# Patient Record
Sex: Female | Born: 1990
Health system: Southern US, Community
[De-identification: ages and names within clinical notes are randomized; demographics above are authoritative.]

## PROBLEM LIST (undated history)

## (undated) ENCOUNTER — Inpatient Hospital Stay (HOSPITAL_COMMUNITY): Payer: Self-pay

## (undated) DIAGNOSIS — G43909 Migraine, unspecified, not intractable, without status migrainosus: Secondary | ICD-10-CM

## (undated) DIAGNOSIS — R519 Headache, unspecified: Secondary | ICD-10-CM

## (undated) DIAGNOSIS — B019 Varicella without complication: Secondary | ICD-10-CM

## (undated) DIAGNOSIS — Z3483 Encounter for supervision of other normal pregnancy, third trimester: Secondary | ICD-10-CM

## (undated) DIAGNOSIS — F1010 Alcohol abuse, uncomplicated: Secondary | ICD-10-CM

## (undated) DIAGNOSIS — B999 Unspecified infectious disease: Secondary | ICD-10-CM

## (undated) DIAGNOSIS — R51 Headache: Secondary | ICD-10-CM

## (undated) HISTORY — PX: NO PAST SURGERIES: SHX2092

## (undated) HISTORY — DX: Migraine, unspecified, not intractable, without status migrainosus: G43.909

## (undated) HISTORY — DX: Alcohol abuse, uncomplicated: F10.10

## (undated) HISTORY — DX: Headache, unspecified: R51.9

## (undated) HISTORY — DX: Headache: R51

## (undated) HISTORY — DX: Varicella without complication: B01.9

---
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
## 2010-05-17 ENCOUNTER — Inpatient Hospital Stay (HOSPITAL_COMMUNITY): Admission: RE | Admit: 2010-05-17 | Discharge: 2010-05-19 | Payer: Self-pay | Admitting: Obstetrics and Gynecology

## 2010-05-24 ENCOUNTER — Ambulatory Visit: Admission: RE | Admit: 2010-05-24 | Discharge: 2010-05-24 | Payer: Self-pay | Admitting: Obstetrics and Gynecology

## 2010-09-21 LAB — RPR: RPR Ser Ql: NONREACTIVE

## 2010-09-21 LAB — CBC
MCH: 27.3 pg (ref 26.0–34.0)
MCHC: 32.7 g/dL (ref 30.0–36.0)
MCV: 83.2 fL (ref 78.0–100.0)
Platelets: 162 10*3/uL (ref 150–400)
RBC: 3.54 MIL/uL — ABNORMAL LOW (ref 3.87–5.11)
RDW: 14.9 % (ref 11.5–15.5)
RDW: 15 % (ref 11.5–15.5)
WBC: 19.5 10*3/uL — ABNORMAL HIGH (ref 4.0–10.5)

## 2012-10-09 LAB — HM PAP SMEAR

## 2013-05-08 ENCOUNTER — Ambulatory Visit (INDEPENDENT_AMBULATORY_CARE_PROVIDER_SITE_OTHER): Payer: BC Managed Care – PPO | Admitting: Family Medicine

## 2013-05-08 ENCOUNTER — Encounter: Payer: Self-pay | Admitting: Family Medicine

## 2013-05-08 ENCOUNTER — Telehealth: Payer: Self-pay | Admitting: *Deleted

## 2013-05-08 VITALS — BP 108/60 | HR 95 | Temp 99.1°F | Ht 63.0 in | Wt 139.2 lb

## 2013-05-08 DIAGNOSIS — J309 Allergic rhinitis, unspecified: Secondary | ICD-10-CM | POA: Insufficient documentation

## 2013-05-08 DIAGNOSIS — H9311 Tinnitus, right ear: Secondary | ICD-10-CM

## 2013-05-08 DIAGNOSIS — G43909 Migraine, unspecified, not intractable, without status migrainosus: Secondary | ICD-10-CM

## 2013-05-08 MED ORDER — RIZATRIPTAN BENZOATE 10 MG PO TBDP: 10.0000 mg | ORAL_TABLET | ORAL | Status: DC | PRN

## 2013-05-08 NOTE — Telephone Encounter (Signed)
Ok to refer to ent for pulsatile tinnitus

## 2013-05-08 NOTE — Patient Instructions (Signed)
Headache and Allergies The relationship between allergies and headaches is unclear. Many people with allergic or infectious nasal problems also have headaches (migraines or sinus headaches). However, sometimes allergies can cause pressure that feels like a headache, and sometimes headaches can cause allergy-like symptoms. It is not always clear whether your symptoms are caused by allergies or by a headache. CAUSES   Migraine: The cause of a migraine is not always known.  Sinus Headache: The cause of a sinus headache may be a sinus infection. Other conditions that may be related to sinus headaches include:  Hay fever (allergic rhinitis).  Deviation of the nasal septum.  Swelling or clogging of the nasal passages. SYMPTOMS  Migraine headache symptoms (which often last 4 to 72 hours) include:  Intense, throbbing pain on one or both sides of the head.  Nausea.  Vomiting.  Being extra sensitive to light.  Being extra sensitive to sound.  Nervous system reactions that appear similar to an allergic reaction:  Stuffy nose.  Runny nose.  Tearing. Sinus headaches are felt as facial pain or pressure.  DIAGNOSIS  Because there is some overlap in symptoms, sinus and migraine headaches are often misdiagnosed. For example, a person with migraines may also feel facial pressure. Likewise, many people with hay fever may get migraine headaches rather than sinus headaches. These migraines can be triggered by the histamine release during an allergic reaction. An antihistamine medicine can eliminate this pain. There are standard criteria that help clarify the difference between these headaches and related allergy or allergy-like symptoms. Your caregiver can use these criteria to determine the proper diagnosis and provide you the best care. TREATMENT  Migraine medicine may help people who have persistent migraine headaches even though their hay fever is controlled. For some people, anti-inflammatory  treatments do not work to relieve migraines. Medicines called triptans (such as sumatriptan) can be helpful for those people. Document Released: 09/17/2003 Document Revised: 09/19/2011 Document Reviewed: 10/09/2009 Exit[employer] Patient Information 2014 [employer].  

## 2013-05-08 NOTE — Telephone Encounter (Signed)
Pt stated that she was scheduled for a MRI but canceled the appointment do to money. Patient would like to know if she could a referral to ENT for a 2nd opioin. Please advise. SW, CMA

## 2013-05-08 NOTE — Assessment & Plan Note (Signed)
dymista Allegra otc May be cause of flutter and /or headache

## 2013-05-08 NOTE — Progress Notes (Signed)
  Subjective:    Brandi Carter is a 22 y.o. female who presents for evaluation of headache. Symptoms began about several years ago, when she was 73 (diagnosed with migraines).   Generally, the headaches last about several days and occur every day. The headaches do not seem to be related to any time of day or year. The headaches are usually dull and are located in fr.  The patient rates her most severe headaches a 9 on a scale from 1 to 10. Recently, the headaches have been stable but she also has had increased fluttering in R ear. . Work attendance or other daily activities are not affected by the headaches. Precipitating factors include: none which have been determined. The headaches are usually not preceded by an aura. Associated neurologic symptoms: fluttering in R ear. The patient denies decreased physical activity, depression, dizziness, loss of balance, muscle weakness, numbness of extremities, speech difficulties, vision problems, vomiting in the early morning and worsening school/work performance. Home treatment has included ibuprofen, darkening the room, resting and sleeping with marked improvement. Other history includes: migraine headaches diagnosed in the past. Family history includes migraine headaches in grandmother, uncle and cousin.  The following portions of the patient's history were reviewed and updated as appropriate:  She  has a past medical history of Migraines; Chicken pox; Frequent headaches; and Alcohol abuse. She  does not have a problem list on file. She  has no past surgical history on file. Her family history includes Alcohol abuse in her father and paternal grandfather; Bipolar disorder in her mother; Emotional abuse in her father; High blood pressure in her father; Schizophrenia in her father. She  reports that she has never smoked. She has never used smokeless tobacco. She reports that she does not drink alcohol or use illicit drugs. She has a current medication list  which includes the following prescription(s): norgestimate-ethinyl estradiol. No current outpatient prescriptions on file prior to visit.   No current facility-administered medications on file prior to visit.   She has No Known Allergies..  Review of Systems Pertinent items are noted in HPI.   Objective:    BP 108/60  Pulse 95  Temp(Src) 99.1 F (37.3 C) (Oral)  Ht 5\' 3"  (1.6 m)  Wt 139 lb 3.2 oz (63.141 kg)  BMI 24.66 kg/m2  SpO2 98% General appearance: alert, cooperative, appears stated age and no distress Head: Normocephalic, without obvious abnormality, atraumatic Eyes: conjunctivae/corneas clear. PERRL, EOM's intact. Fundi benign. Ears: TM dull b/l-- + fluid Nose: clear discharge, moderate congestion, sinus tenderness bilateral Throat: abnormal findings: + PND Neck: no adenopathy, supple, symmetrical, trachea midline and thyroid not enlarged, symmetric, no tenderness/mass/nodules Lungs: clear to auscultation bilaterally Heart: S1, S2 normal Extremities: extremities normal, atraumatic, no cyanosis or edema Neurologic: Alert and oriented X 3, normal strength and tone. Normal symmetric reflexes. Normal coordination and gait    Assessment:    Classic migraine ear flutter    Plan:    Lie in darkened room and apply cold packs as needed for pain. Episodic therapy: triptan therapy due to low frequency of pain. Side effect profile discussed in detail. Asked to keep headache diary. Neurodiagnostic workup: MRI to r/o tumor. Follow up in 1 month. --or sooner prn

## 2013-05-09 LAB — CBC WITH DIFFERENTIAL/PLATELET
Basophils Absolute: 0 10*3/uL (ref 0.0–0.1)
Eosinophils Relative: 3.6 % (ref 0.0–5.0)
HCT: 43.1 % (ref 36.0–46.0)
Hemoglobin: 14.5 g/dL (ref 12.0–15.0)
Lymphs Abs: 2.5 10*3/uL (ref 0.7–4.0)
MCHC: 33.6 g/dL (ref 30.0–36.0)
Monocytes Absolute: 0.5 10*3/uL (ref 0.1–1.0)
Monocytes Relative: 6.6 % (ref 3.0–12.0)
Neutro Abs: 3.7 10*3/uL (ref 1.4–7.7)
Platelets: 168 10*3/uL (ref 150.0–400.0)
RBC: 4.86 Mil/uL (ref 3.87–5.11)
RDW: 13.1 % (ref 11.5–14.6)
WBC: 6.9 10*3/uL (ref 4.5–10.5)

## 2013-05-09 LAB — BASIC METABOLIC PANEL
Calcium: 9.5 mg/dL (ref 8.4–10.5)
Chloride: 106 mEq/L (ref 96–112)
Creatinine, Ser: 0.6 mg/dL (ref 0.4–1.2)
Potassium: 4.1 mEq/L (ref 3.5–5.1)

## 2013-05-09 LAB — HEPATIC FUNCTION PANEL
Albumin: 3.8 g/dL (ref 3.5–5.2)
Total Bilirubin: 0.5 mg/dL (ref 0.3–1.2)

## 2013-05-09 LAB — TSH: TSH: 1.54 u[IU]/mL (ref 0.35–5.50)

## 2013-05-11 ENCOUNTER — Ambulatory Visit (HOSPITAL_BASED_OUTPATIENT_CLINIC_OR_DEPARTMENT_OTHER): Payer: BC Managed Care – PPO

## 2013-05-16 ENCOUNTER — Other Ambulatory Visit: Payer: Self-pay

## 2013-05-30 ENCOUNTER — Encounter: Payer: Self-pay | Admitting: Family Medicine

## 2014-01-08 LAB — HM PAP SMEAR

## 2014-06-03 ENCOUNTER — Ambulatory Visit (INDEPENDENT_AMBULATORY_CARE_PROVIDER_SITE_OTHER): Payer: 59 | Admitting: Internal Medicine

## 2014-06-03 ENCOUNTER — Encounter: Payer: Self-pay | Admitting: Internal Medicine

## 2014-06-03 VITALS — BP 110/72 | HR 98 | Temp 98.9°F | Wt 134.5 lb

## 2014-06-03 DIAGNOSIS — J0110 Acute frontal sinusitis, unspecified: Secondary | ICD-10-CM

## 2014-06-03 MED ORDER — CEFUROXIME AXETIL 250 MG PO TABS: 250.0000 mg | ORAL_TABLET | Freq: Two times a day (BID) | ORAL | Status: DC

## 2014-06-03 NOTE — Patient Instructions (Signed)
Plain Mucinex (NOT D) for thick secretions ;force NON dairy fluids .   Nasal cleansing in the shower as discussed with lather of mild shampoo.After 10 seconds wash off lather while  exhaling through nostrils. Make sure that all residual soap is removed to prevent irritation.  Flonase OR Nasacort AQ 1 spray in each nostril twice a day as needed. Use the "crossover" technique into opposite nostril spraying toward opposite ear @ 45 degree angle, not straight up into nostril.  Use a Neti pot daily only  as needed for significant sinus congestion; going from open side to congested side . Plain Allegra (NOT D )  160 daily , Loratidine 10 mg , OR Zyrtec 10 mg @ bedtime  as needed for itchy eyes & sneezing. Fill the  prescription for antibiotic it there is not dramatic improvement in the next 48-72 hours.

## 2014-06-03 NOTE — Progress Notes (Signed)
   Subjective:    Patient ID: Brandi Carter, female    DOB: 07-Mar-1991, 23 y.o.   MRN: [id number]  HPI    Symptoms began 11/18 as sore throat associated with rhinitis,sneezing and head congestion  She now has frontal headache, nasal purulence with green discharge, sore throat on the right, and right earache.  She is a history of recurrent tonsillitis Her 23-year-old has recurrent respiratory tract infections.  The patient also has perennial allergies    Review of Systems  Facial pain , dental pain,  or otic discharge denied. No fever , chills or sweats.   Extrinsic symptoms of itchy, watery eyes denied. There is no significant cough, sputum production, wheezing,or  paroxysmal nocturnal dyspnea.      Objective:   Physical Exam General appearance:good health ;well nourished; no acute distress or increased work of breathing is present.  No  lymphadenopathy about the head, neck, or axilla noted.   Eyes: No conjunctival inflammation or lid edema is present. There is no scleral icterus.  Ears:  External ear exam shows no significant lesions or deformities.  Otoscopic examination reveals clear canals, tympanic membranes are intact bilaterally without bulging, retraction, inflammation or discharge.  Nose:  External nasal examination shows no deformity or inflammation. Nasal mucosa are pink and moist without lesions or exudates. No septal dislocation or deviation.No obstruction to airflow.   Oral exam: Dental hygiene is good; lips and gums are healthy appearing.There is mild oropharyngeal erythema w/o exudate noted.   Neck:  No deformities, thyromegaly, masses, or tenderness noted.   Supple with full range of motion without pain.   Heart:  Normal rate and regular rhythm. S1 and S2 normal without gallop, murmur, click, rub or other extra sounds.   Lungs:Chest clear to auscultation; no wheezes, rhonchi,rales ,or rubs present.No increased work of breathing.    Extremities:  No  cyanosis, edema, or clubbing  noted    Skin: Warm & dry w/o jaundice or tenting.Tatoo upper back         Assessment & Plan:  #1 rhinosinusitis without significant bronchitis  Plan: Nasal hygiene interventions discussed. See prescription medications

## 2014-06-03 NOTE — Progress Notes (Signed)
Pre visit review using our clinic review tool, if applicable. No additional management support is needed unless otherwise documented below in the visit note. 

## 2015-07-12 NOTE — L&D Delivery Note (Signed)
Delivery Note At 12:15 AM a viable and healthy female was delivered via Vaginal, Spontaneous Delivery (Presentation: Occiput Anterior, ROT).  APGAR: 9, 9; weight  P.   Placenta status:delivered, intact .  Cord: 3 vessels with the following complications: None.   Anesthesia: Epidural  Episiotomy: None Lacerations: 1st degree perineal Suture Repair: 3.0 vicryl rapide Est. Blood Loss (mL):  100cc  Mom to postpartum.  Baby to Couplet care / Skin to Skin.  Bovard-Stuckert, Brandi Carter 01/23/2016, 12:39 AM   D/W pt r/b/a of circumcision for female infant  O+/RI/Tdap in PNC/IUD/Br

## 2015-07-20 LAB — OB RESULTS CONSOLE ANTIBODY SCREEN: ANTIBODY SCREEN: NEGATIVE

## 2015-07-20 LAB — OB RESULTS CONSOLE RPR: RPR: NONREACTIVE

## 2015-07-20 LAB — OB RESULTS CONSOLE ABO/RH: RH Type: POSITIVE

## 2015-07-20 LAB — OB RESULTS CONSOLE RUBELLA ANTIBODY, IGM: RUBELLA: IMMUNE

## 2015-07-20 LAB — OB RESULTS CONSOLE GC/CHLAMYDIA
CHLAMYDIA, DNA PROBE: NEGATIVE
GC PROBE AMP, GENITAL: NEGATIVE

## 2015-07-20 LAB — OB RESULTS CONSOLE HIV ANTIBODY (ROUTINE TESTING): HIV: NONREACTIVE

## 2015-07-20 LAB — OB RESULTS CONSOLE HEPATITIS B SURFACE ANTIGEN: Hepatitis B Surface Ag: NEGATIVE

## 2015-07-29 ENCOUNTER — Encounter: Payer: Self-pay | Admitting: Family Medicine

## 2015-11-19 ENCOUNTER — Encounter (HOSPITAL_COMMUNITY): Payer: Self-pay | Admitting: *Deleted

## 2015-11-19 ENCOUNTER — Inpatient Hospital Stay (HOSPITAL_COMMUNITY)
Admission: AD | Admit: 2015-11-19 | Discharge: 2015-11-19 | Disposition: A | Discharge status: Home / Self Care | Payer: BLUE CROSS/BLUE SHIELD | Source: Ambulatory Visit | Attending: Obstetrics and Gynecology | Admitting: Obstetrics and Gynecology

## 2015-11-19 DIAGNOSIS — Z3A30 30 weeks gestation of pregnancy: Secondary | ICD-10-CM | POA: Diagnosis not present

## 2015-11-19 DIAGNOSIS — R112 Nausea with vomiting, unspecified: Secondary | ICD-10-CM | POA: Diagnosis present

## 2015-11-19 DIAGNOSIS — A084 Viral intestinal infection, unspecified: Secondary | ICD-10-CM | POA: Diagnosis not present

## 2015-11-19 DIAGNOSIS — O219 Vomiting of pregnancy, unspecified: Secondary | ICD-10-CM | POA: Diagnosis not present

## 2015-11-19 DIAGNOSIS — Z87891 Personal history of nicotine dependence: Secondary | ICD-10-CM | POA: Diagnosis not present

## 2015-11-19 DIAGNOSIS — O26893 Other specified pregnancy related conditions, third trimester: Secondary | ICD-10-CM | POA: Insufficient documentation

## 2015-11-19 DIAGNOSIS — E860 Dehydration: Secondary | ICD-10-CM | POA: Diagnosis not present

## 2015-11-19 HISTORY — DX: Unspecified infectious disease: B99.9

## 2015-11-19 LAB — URINE MICROSCOPIC-ADD ON: RBC / HPF: NONE SEEN RBC/hpf (ref 0–5)

## 2015-11-19 LAB — URINALYSIS, ROUTINE W REFLEX MICROSCOPIC
BILIRUBIN URINE: NEGATIVE
Glucose, UA: NEGATIVE mg/dL
HGB URINE DIPSTICK: NEGATIVE
NITRITE: NEGATIVE
Protein, ur: 30 mg/dL — AB
Specific Gravity, Urine: >1.03 — ABNORMAL HIGH (ref 1.005–1.030)
pH: 5.5 (ref 5.0–8.0)

## 2015-11-19 MED ORDER — PROMETHAZINE HCL 25 MG/ML IJ SOLN
25.0000 mg | Freq: Once | INTRAVENOUS | Status: AC
  Administered 2015-11-19: 25 mg via INTRAVENOUS
  Filled 2015-11-19: qty 1

## 2015-11-19 MED ORDER — ONDANSETRON 4 MG PO TBDP: 4.0000 mg | ORAL_TABLET | Freq: Four times a day (QID) | ORAL | Status: DC | PRN

## 2015-11-19 MED ORDER — ONDANSETRON HCL 4 MG/2ML IJ SOLN
4.0000 mg | Freq: Once | INTRAMUSCULAR | Status: AC
  Administered 2015-11-19: 4 mg via INTRAVENOUS
  Filled 2015-11-19: qty 2

## 2015-11-19 MED ORDER — FAMOTIDINE IN NACL 20-0.9 MG/50ML-% IV SOLN
20.0000 mg | Freq: Once | INTRAVENOUS | Status: AC
  Administered 2015-11-19: 20 mg via INTRAVENOUS
  Filled 2015-11-19: qty 50

## 2015-11-19 MED ORDER — DEXTROSE 5 % IN LACTATED RINGERS IV BOLUS
1000.0000 mL | Freq: Once | INTRAVENOUS | Status: AC
  Administered 2015-11-19: 1000 mL via INTRAVENOUS

## 2015-11-19 NOTE — MAU Provider Note (Signed)
Chief Complaint:  Emesis and Diarrhea   First Provider Initiated Contact with Patient 11/19/15 1147      HPI: Brandi Carter is a 25 y.o. [id number] at 104w1dwho presents to maternity admissions reporting onset of n/v/d at 2 am yesterday morning.  She reports vomiting x 4-5 and diarrhea x 8-9+ in 24 hours. She denies sick contacts and originally thought she was reacting to her taco dinner the night before onset but no other family has been sick.  She has tried to drink fluids but is unable to keep anything down.   She has not tried any medications.  She denies abdominal cramping and reports normal fetal movement.  She denies LOF, vaginal bleeding, vaginal itching/burning, urinary symptoms, h/a, dizziness, or fever/chills.    HPI  Past Medical History: Past Medical History  Diagnosis Date  . Migraines   . Chicken pox   . Frequent headaches   . Alcohol abuse     As a teenager  . Infection     UTI    Past obstetric history: OB History  Gravida Para Term Preterm AB SAB TAB Ectopic Multiple Living  5 1  0 3 3 0 0 0 1    # Outcome Date GA Lbr Len/2nd Weight Sex Delivery Anes PTL Lv  5 Current           4 SAB           3 SAB           2 SAB           1 Para               Past Surgical History: Past Surgical History  Procedure Laterality Date  . No past surgeries      Family History: Family History  Problem Relation Age of Onset  . Alcohol abuse Father   . High blood pressure Father   . Emotional abuse Father   . Schizophrenia Father   . Alcohol abuse Paternal Grandfather   . Cancer Paternal Grandfather   . Bipolar disorder Mother   . Fibromyalgia Paternal Aunt     Social History: Social History  Substance Use Topics  . Smoking status: Former Games developer  . Smokeless tobacco: Never Used     Comment: quit with preg  . Alcohol Use: No    Allergies: No Known Allergies  Meds:  Prescriptions prior to admission  Medication Sig Dispense Refill Last Dose  . Prenatal  Vit-Fe Fumarate-FA (PRENATAL MULTIVITAMIN) TABS tablet Take 1 tablet by mouth daily at 12 noon.   11/17/2015  . ranitidine (ZANTAC) 150 MG tablet Take 150 mg by mouth 2 (two) times daily.   11/19/2015 at Unknown time  . cefUROXime (CEFTIN) 250 MG tablet Take 1 tablet (250 mg total) by mouth 2 (two) times daily with a meal. (Patient not taking: Reported on 11/19/2015) 20 tablet 0   . rizatriptan (MAXALT-MLT) 10 MG disintegrating tablet Take 1 tablet (10 mg total) by mouth as needed for migraine. May repeat in 2 hours if needed (Patient not taking: Reported on 11/19/2015) 10 tablet 0 Taking    ROS:  Review of Systems  Constitutional: Negative for fever, chills and fatigue.  Respiratory: Negative for shortness of breath.   Cardiovascular: Negative for chest pain.  Gastrointestinal: Positive for nausea, vomiting and diarrhea.  Genitourinary: Negative for dysuria, flank pain, vaginal bleeding, vaginal discharge, difficulty urinating, vaginal pain and pelvic pain.  Neurological: Negative for dizziness and headaches.  Psychiatric/Behavioral:  Negative.      I have reviewed patient's Past Medical Hx, Surgical Hx, Family Hx, Social Hx, medications and allergies.   Physical Exam   Patient Vitals for the past 24 hrs:  BP Temp Pulse Resp Height Weight  11/19/15 1405 (!) 90/50 mmHg - 103 16 - -  11/19/15 1051 112/68 mmHg 98 F (36.7 C) (!) 122 18  (1.575 m) 154 lb 1.9 oz (69.908 kg)   Constitutional: Well-developed, well-nourished female in no acute distress.  Cardiovascular: normal rate Respiratory: normal effort GI: Abd soft, non-tender, gravid appropriate for gestational age.  MS: Extremities nontender, no edema, normal ROM Neurologic: Alert and oriented x 4.  GU: Neg CVAT.  PELVIC EXAM: Cervix pink, visually closed, without lesion, scant white creamy discharge, vaginal walls and external genitalia normal Bimanual exam: Cervix 0/long/high, firm, anterior, neg CMT, uterus nontender,  nonenlarged, adnexa without tenderness, enlargement, or mass     FHT:  Baseline 145, moderate variability, 10x10 accelerations present, no decelerations Contractions: None on toco or to palpation   Labs: Results for orders placed or performed during the hospital encounter of 11/19/15 (from the past 24 hour(s))  Urinalysis, Routine w reflex microscopic (not at [facility])     Status: Abnormal   Collection Time: 11/19/15 10:50 AM  Result Value Ref Range   Color, Urine YELLOW YELLOW   APPearance HAZY (A) CLEAR   Specific Gravity, Urine >1.030 (H) 1.005 - 1.030   pH 5.5 5.0 - 8.0   Glucose, UA NEGATIVE NEGATIVE mg/dL   Hgb urine dipstick NEGATIVE NEGATIVE   Bilirubin Urine NEGATIVE NEGATIVE   Ketones, ur >80 (A) NEGATIVE mg/dL   Protein, ur 30 (A) NEGATIVE mg/dL   Nitrite NEGATIVE NEGATIVE   Leukocytes, UA TRACE (A) NEGATIVE  Urine microscopic-add on     Status: Abnormal   Collection Time: 11/19/15 10:50 AM  Result Value Ref Range   Squamous Epithelial / LPF 6-30 (A) NONE SEEN   WBC, UA 0-5 0 - 5 WBC/hpf   RBC / HPF NONE SEEN 0 - 5 RBC/hpf   Bacteria, UA FEW (A) NONE SEEN      Imaging:  No results found.  MAU Course/MDM: D5LR x 1000 ml, Phenergan 25 mg in LR x 1000 ml, and Pepcid 20 mg IV given.  Pt reports improvement in symptoms and no vomiting or diarrhea while in MAU.  NST reactive with no abdominal pain or preterm labor s/sx.  Consult Dr Mindi Slicker.  D/C home with Zofran 4 mg ODT Q 8 hours PRN.  F/U in office as scheduled.  Pt stable at time of discharge.  Assessment: 1. Viral gastroenteritis   2. Mild dehydration   3. Nausea and vomiting during pregnancy     Plan: Discharge home Zofran 4 mg ODT Q 8 hours PRN Increase PO fluids      Follow-up Information    Follow up with Sharol Given Banga, DO.   Specialty:  Obstetrics and Gynecology   Why:  As scheduled, Return to MAU as needed for emergencies   Contact information:   [address] [place] [address] [place]  [address] [phone]        Medication List    STOP taking these medications        cefUROXime 250 MG tablet  Commonly known as:  CEFTIN     rizatriptan 10 MG disintegrating tablet  Commonly known as:  MAXALT-MLT      TAKE these medications  ondansetron 4 MG disintegrating tablet  Commonly known as:  ZOFRAN ODT  Take 1 tablet (4 mg total) by mouth every 6 (six) hours as needed for nausea.     prenatal multivitamin Tabs tablet  Take 1 tablet by mouth daily at 12 noon.     ranitidine 150 MG tablet  Commonly known as:  ZANTAC  Take 150 mg by mouth 2 (two) times daily.        Sharen CounterLisa Leftwich-Kirby Certified Nurse-Midwife 11/19/2015 2:19 PM

## 2015-11-19 NOTE — MAU Note (Signed)
Nausea, vomiting and diarrhea, started about 36hrs ago. No fever. no pain, just " sore

## 2015-11-19 NOTE — MAU Note (Signed)
Pt reports she stared having n/v yesterday and diarrhea last night. No fever.

## 2015-11-19 NOTE — Discharge Instructions (Signed)

## 2015-12-25 LAB — OB RESULTS CONSOLE GBS: STREP GROUP B AG: NEGATIVE

## 2016-01-19 ENCOUNTER — Encounter (HOSPITAL_COMMUNITY): Payer: Self-pay | Admitting: *Deleted

## 2016-01-19 ENCOUNTER — Telehealth (HOSPITAL_COMMUNITY): Payer: Self-pay | Admitting: *Deleted

## 2016-01-19 NOTE — Telephone Encounter (Signed)
Preadmission screen  

## 2016-01-20 ENCOUNTER — Encounter (HOSPITAL_COMMUNITY): Payer: Self-pay | Admitting: *Deleted

## 2016-01-21 ENCOUNTER — Encounter (HOSPITAL_COMMUNITY): Payer: Self-pay

## 2016-01-21 DIAGNOSIS — Z3483 Encounter for supervision of other normal pregnancy, third trimester: Secondary | ICD-10-CM

## 2016-01-21 HISTORY — DX: Encounter for supervision of other normal pregnancy, third trimester: Z34.83

## 2016-01-21 NOTE — H&P (Addendum)
DEBORAHANN POTEAT is a 25 y.o. female [phone] at 39+2 for IOL given term status and favorable cervix.  +FM, no LOF, no VB, occ ctx.  Relatively uncomplicated prenatal care, h/o SAB x 2 - pt with prometrium supplementation for first 12 weeks.  Low-lying placenta resolved.    Maternal Medical History:  Contractions: Frequency: irregular.    Fetal activity: Perceived fetal activity is normal.    Prenatal Complications - Diabetes: none.    OB History    Gravida Para Term Preterm AB TAB SAB Ectopic Multiple Living   0 3 0 3 0 0 1    G1 SAB, G2 TSVD 7#15, female 05/17/10, G3 SAB G4 SAB G5 present H/o abn pap - last 7/15 WNL No STDs  Past Medical History  Diagnosis Date  . Migraines   . Chicken pox   . Frequent headaches   . Alcohol abuse     As a teenager  . Infection     UTI  . Normal pregnancy in multigravida in third trimester 01/21/2016   Past Surgical History  Procedure Laterality Date  . No past surgeries     Family History: family history includes Alcohol abuse in her father and paternal grandfather; Bipolar disorder in her mother; Cancer in her paternal grandfather; Emotional abuse in her father; Fibromyalgia in her paternal aunt; High blood pressure in her father; Schizophrenia in her father. Social History:  reports that she has quit smoking. She has never used smokeless tobacco. 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   HENT: Negative.   Eyes: Negative.   Respiratory: Negative.    Cardiovascular: Negative.   Gastrointestinal: Negative.   Genitourinary: Negative.   Musculoskeletal: Negative.   Skin: Negative.   Neurological: Negative.   Psychiatric/Behavioral: Negative.       There were no vitals taken for this visit. Maternal Exam:  Uterine Assessment: Contraction strength is moderate.  Contraction frequency is irregular.   Abdomen: Patient reports no abdominal tenderness. Fundal height is appropriate for gestation.   Estimated fetal weight is 7.5-8.5#.   Fetal presentation: vertex  Introitus: Normal vulva. Normal vagina.  Pelvis: adequate for delivery.   Cervix: Cervix evaluated by digital exam.     Physical Exam  Constitutional: She is oriented to person, place, and time. She appears well-developed and well-nourished.  HENT:  Head: Normocephalic and atraumatic.  Cardiovascular: Normal rate and regular rhythm.   Respiratory: Effort normal and breath sounds normal. No respiratory distress. She has no wheezes.  GI: Soft. Bowel sounds are normal. She exhibits no distension. There is no tenderness.  Musculoskeletal: Normal range of motion.  Neurological: She is alert and oriented to person, place, and time.  Skin: Skin is warm and dry.  Psychiatric: She has a normal mood and affect. Her behavior is normal.    Prenatal labs: ABO, Rh: O/Positive/-- (01/09 0000) Antibody: Negative (01/09 0000) Rubella: Immune (01/09 0000) RPR: Nonreactive (01/09 0000)  HBsAg: Negative (01/09 0000)  HIV: Non-reactive (01/09 0000)  GBS: Negative (06/16 0000)   Hgb 13.8/Plt 208/Ur Cx neg/GC neg/  Chl neg/Varicella immune/ Nl First Tri Scr and NT glucola 131 Nl anat, ant plac, female  Assessment/Plan: 14XW [id number] G5P1031 at 39+ for IOL AROM and pitocin for IOL gbbs negative - no prophylaxis Epidural prn Expect SVD Pt has paid for circumcision in hospital  Bovard-Stuckert, Josip Merolla 01/21/2016, 10:06 PM

## 2016-01-22 ENCOUNTER — Inpatient Hospital Stay (HOSPITAL_COMMUNITY): Payer: BLUE CROSS/BLUE SHIELD | Admitting: Anesthesiology

## 2016-01-22 ENCOUNTER — Encounter (HOSPITAL_COMMUNITY): Payer: Self-pay

## 2016-01-22 ENCOUNTER — Inpatient Hospital Stay (HOSPITAL_COMMUNITY)
Admission: RE | Admit: 2016-01-22 | Discharge: 2016-01-24 | DRG: 775 | Disposition: A | Discharge status: Home / Self Care | Payer: BLUE CROSS/BLUE SHIELD | Source: Ambulatory Visit | Attending: Obstetrics and Gynecology | Admitting: Obstetrics and Gynecology

## 2016-01-22 VITALS — BP 93/51 | HR 71 | Temp 98.1°F | Resp 16 | Ht 62.0 in | Wt 169.0 lb

## 2016-01-22 DIAGNOSIS — Z3A39 39 weeks gestation of pregnancy: Secondary | ICD-10-CM | POA: Diagnosis not present

## 2016-01-22 DIAGNOSIS — F17210 Nicotine dependence, cigarettes, uncomplicated: Secondary | ICD-10-CM | POA: Diagnosis present

## 2016-01-22 DIAGNOSIS — Z3483 Encounter for supervision of other normal pregnancy, third trimester: Secondary | ICD-10-CM

## 2016-01-22 DIAGNOSIS — O99334 Smoking (tobacco) complicating childbirth: Secondary | ICD-10-CM | POA: Diagnosis present

## 2016-01-22 HISTORY — DX: Encounter for supervision of other normal pregnancy, third trimester: Z34.83

## 2016-01-22 LAB — CBC
HCT: 32.3 % — ABNORMAL LOW (ref 36.0–46.0)
Hemoglobin: 10.8 g/dL — ABNORMAL LOW (ref 12.0–15.0)
MCH: 27.4 pg (ref 26.0–34.0)
MCHC: 33.4 g/dL (ref 30.0–36.0)
MCV: 82 fL (ref 78.0–100.0)
PLATELETS: 233 10*3/uL (ref 150–400)
RBC: 3.94 MIL/uL (ref 3.87–5.11)
RDW: 14.4 % (ref 11.5–15.5)
WBC: 13.8 10*3/uL — AB (ref 4.0–10.5)

## 2016-01-22 LAB — TYPE AND SCREEN
ABO/RH(D): O POS
ANTIBODY SCREEN: NEGATIVE

## 2016-01-22 MED ORDER — ONDANSETRON HCL 4 MG/2ML IJ SOLN: 4.0000 mg | Freq: Four times a day (QID) | INTRAMUSCULAR | Status: DC | PRN

## 2016-01-22 MED ORDER — OXYTOCIN BOLUS FROM INFUSION
500.0000 mL | INTRAVENOUS | Status: DC
  Administered 2016-01-23: 500 mL via INTRAVENOUS

## 2016-01-22 MED ORDER — DIPHENHYDRAMINE HCL 50 MG/ML IJ SOLN: 12.5000 mg | INTRAMUSCULAR | Status: DC | PRN

## 2016-01-22 MED ORDER — LACTATED RINGERS IV SOLN: 500.0000 mL | INTRAVENOUS | Status: DC | PRN

## 2016-01-22 MED ORDER — BUTORPHANOL TARTRATE 1 MG/ML IJ SOLN: 1.0000 mg | INTRAMUSCULAR | Status: DC | PRN

## 2016-01-22 MED ORDER — ACETAMINOPHEN 325 MG PO TABS
650.0000 mg | ORAL_TABLET | ORAL | Status: DC | PRN
  Administered 2016-01-23: 650 mg via ORAL
  Filled 2016-01-22: qty 2

## 2016-01-22 MED ORDER — FAMOTIDINE 20 MG PO TABS: 20.0000 mg | ORAL_TABLET | Freq: Every day | ORAL | Status: DC

## 2016-01-22 MED ORDER — LACTATED RINGERS IV SOLN
500.0000 mL | Freq: Once | INTRAVENOUS | Status: AC
  Administered 2016-01-22: 500 mL via INTRAVENOUS

## 2016-01-22 MED ORDER — EPHEDRINE 5 MG/ML INJ
10.0000 mg | INTRAVENOUS | Status: DC | PRN
  Filled 2016-01-22: qty 2

## 2016-01-22 MED ORDER — OXYCODONE-ACETAMINOPHEN 5-325 MG PO TABS
2.0000 | ORAL_TABLET | ORAL | Status: DC | PRN
Start: 2016-01-22 — End: 2016-01-23

## 2016-01-22 MED ORDER — PHENYLEPHRINE 40 MCG/ML (10ML) SYRINGE FOR IV PUSH (FOR BLOOD PRESSURE SUPPORT)
80.0000 ug | PREFILLED_SYRINGE | INTRAVENOUS | Status: DC | PRN
Start: 2016-01-22 — End: 2016-01-24
  Filled 2016-01-22: qty 5

## 2016-01-22 MED ORDER — LIDOCAINE HCL (PF) 1 % IJ SOLN
30.0000 mL | INTRAMUSCULAR | Status: DC | PRN
  Filled 2016-01-22: qty 30

## 2016-01-22 MED ORDER — PHENYLEPHRINE 40 MCG/ML (10ML) SYRINGE FOR IV PUSH (FOR BLOOD PRESSURE SUPPORT)
80.0000 ug | PREFILLED_SYRINGE | INTRAVENOUS | Status: DC | PRN
  Filled 2016-01-22: qty 5

## 2016-01-22 MED ORDER — EPHEDRINE 5 MG/ML INJ
10.0000 mg | INTRAVENOUS | Status: DC | PRN
Start: 2016-01-22 — End: 2016-01-24
  Filled 2016-01-22: qty 2

## 2016-01-22 MED ORDER — OXYTOCIN 40 UNITS IN LACTATED RINGERS INFUSION - SIMPLE MED: 2.5000 [IU]/h | INTRAVENOUS | Status: DC

## 2016-01-22 MED ORDER — SOD CITRATE-CITRIC ACID 500-334 MG/5ML PO SOLN
30.0000 mL | ORAL | Status: DC | PRN
  Administered 2016-01-22: 30 mL via ORAL
  Filled 2016-01-22: qty 15

## 2016-01-22 MED ORDER — LACTATED RINGERS IV SOLN: 500.0000 mL | Freq: Once | INTRAVENOUS | Status: DC

## 2016-01-22 MED ORDER — FENTANYL 2.5 MCG/ML BUPIVACAINE 1/10 % EPIDURAL INFUSION (WH - ANES)
INTRAMUSCULAR | Status: AC
  Filled 2016-01-22: qty 125

## 2016-01-22 MED ORDER — OXYCODONE-ACETAMINOPHEN 5-325 MG PO TABS: 1.0000 | ORAL_TABLET | ORAL | Status: DC | PRN

## 2016-01-22 MED ORDER — PHENYLEPHRINE 40 MCG/ML (10ML) SYRINGE FOR IV PUSH (FOR BLOOD PRESSURE SUPPORT)
PREFILLED_SYRINGE | INTRAVENOUS | Status: AC
  Filled 2016-01-22: qty 20

## 2016-01-22 MED ORDER — SODIUM BICARBONATE 8.4 % IV SOLN
INTRAVENOUS | Status: DC | PRN
  Administered 2016-01-22: 4 mL via EPIDURAL
  Administered 2016-01-22: 5 mL via EPIDURAL

## 2016-01-22 MED ORDER — OXYTOCIN 40 UNITS IN LACTATED RINGERS INFUSION - SIMPLE MED
1.0000 m[IU]/min | INTRAVENOUS | Status: DC
  Administered 2016-01-22: 2 m[IU]/min via INTRAVENOUS
  Filled 2016-01-22: qty 1000

## 2016-01-22 MED ORDER — LIDOCAINE HCL (PF) 1 % IJ SOLN
INTRAMUSCULAR | Status: DC | PRN
  Administered 2016-01-22: 6 mL via EPIDURAL
  Administered 2016-01-22: 6 mL

## 2016-01-22 MED ORDER — FENTANYL 2.5 MCG/ML BUPIVACAINE 1/10 % EPIDURAL INFUSION (WH - ANES)
14.0000 mL/h | INTRAMUSCULAR | Status: DC | PRN
  Administered 2016-01-22 (×2): 14 mL/h via EPIDURAL

## 2016-01-22 MED ORDER — LACTATED RINGERS IV SOLN
INTRAVENOUS | Status: DC
  Administered 2016-01-22 (×3): via INTRAVENOUS

## 2016-01-22 MED ORDER — TERBUTALINE SULFATE 1 MG/ML IJ SOLN
0.2500 mg | Freq: Once | INTRAMUSCULAR | Status: DC | PRN
  Filled 2016-01-22: qty 1

## 2016-01-22 NOTE — Progress Notes (Signed)
Patient ID: Brandi MedicusDeborah C Barnwell, female   DOB: 1991/04/28, 25 y.o.   MRN: [id number]  No changes from H&P  AFVSS gen NAD FHTs 130's reactive, good var toco q 3-464min  SVE 4.5/50/-3  AROM for clear fluid, w/o diff/comp  Continue IOL with pitocin

## 2016-01-22 NOTE — Anesthesia Preprocedure Evaluation (Signed)
Anesthesia Evaluation  Patient identified by MRN, date of birth, ID band Patient awake    Reviewed: Allergy & Precautions, NPO status , Patient's Chart, lab work & pertinent test results  History of Anesthesia Complications Negative for: history of anesthetic complications  Airway Mallampati: II  TM Distance: >3 FB Neck ROM: Full    Dental  (+) Dental Advisory Given   Pulmonary former smoker,    breath sounds clear to auscultation       Cardiovascular negative cardio ROS   Rhythm:Regular Rate:Normal     Neuro/Psych  Headaches,    GI/Hepatic Neg liver ROS, GERD  Poorly Controlled and Medicated,  Endo/Other  negative endocrine ROS  Renal/GU negative Renal ROS     Musculoskeletal   Abdominal   Peds  Hematology Hb 10.8, plt 233k   Anesthesia Other Findings   Reproductive/Obstetrics (+) Pregnancy                             Anesthesia Physical Anesthesia Plan  ASA: II  Anesthesia Plan: Epidural   Post-op Pain Management:    Induction:   Airway Management Planned: Natural Airway  Additional Equipment:   Intra-op Plan:   Post-operative Plan:   Informed Consent: I have reviewed the patients History and Physical, chart, labs and discussed the procedure including the risks, benefits and alternatives for the proposed anesthesia with the patient or authorized representative who has indicated his/her understanding and acceptance.   Dental advisory given  Plan Discussed with:   Anesthesia Plan Comments: (Patient identified. Risks/Benefits/Options discussed with patient including but not limited to bleeding, infection, nerve damage, paralysis, failed block, incomplete pain control, headache, blood pressure changes, nausea, vomiting, reactions to medication both or allergic, itching and postpartum back pain. Confirmed with bedside nurse the patient's most recent platelet count. Confirmed  with patient that they are not currently taking any anticoagulation, have any bleeding history or any family history of bleeding disorders. Patient expressed understanding and wished to proceed. All questions were answered.  )        Anesthesia Quick Evaluation

## 2016-01-22 NOTE — Anesthesia Pain Management Evaluation Note (Signed)
  CRNA Pain Management Visit Note  Patient: Brandi MedicusDeborah C Carter, 25 y.o., female  "Hello I am a member of the anesthesia team at [facility]. We have an anesthesia team available at all times to provide care throughout the hospital, including epidural management and anesthesia for C-section. I don't know your plan for the delivery whether it a natural birth, water birth, IV sedation, nitrous supplementation, doula or epidural, but we want to meet your pain goals."   1.Was your pain managed to your expectations on prior hospitalizations?   Yes   2.What is your expectation for pain management during this hospitalization?     Epidural  3.How can we help you reach that goal? Epidural when I am able to get it.  Record the patient's initial score and the patient's pain goal.   Pain: 0  Pain Goal: 6 [facility] wants you to be able to say your pain was always managed very well.  Kruz Chiu 01/22/2016

## 2016-01-22 NOTE — Anesthesia Procedure Notes (Signed)
Epidural Patient location during procedure: OB Start time: 01/22/2016 4:25 PM End time: 01/22/2016 4:45 PM  Staffing Anesthesiologist: Jairo BenJACKSON, Analys Ryden Performed by: anesthesiologist   Preanesthetic Checklist Completed: patient identified, surgical consent, pre-op evaluation, timeout performed, IV checked, risks and benefits discussed and monitors and equipment checked  Epidural Patient position: sitting Prep: site prepped and draped and DuraPrep Patient monitoring: blood pressure, continuous pulse ox and heart rate Approach: midline Location: L2-L3 Injection technique: LOR air  Needle:  Needle type: Tuohy  Needle gauge: 17 G Needle length: 9 cm Needle insertion depth: 5.5 cm Catheter type: closed end flexible Catheter size: 19 Gauge Catheter at skin depth: 11 cm Test dose: negative (1% lidocaine)  Additional Notes Pt identified in Labor room.  Monitors applied. Working IV access confirmed. Sterile prep, drape lumbar spine.  1% lido local L 2,3.  #17ga Touhy LOR air at 5.5 cm L 2,3, cath in easily to 11 cm skin. Test dose OK, cath dosed and infusion begun.  Patient asymptomatic, VSS, no heme aspirated, tolerated well.  Brandi Carter Audrie Kuri, MD Reason for block:procedure for pain

## 2016-01-23 ENCOUNTER — Encounter (HOSPITAL_COMMUNITY): Payer: Self-pay

## 2016-01-23 LAB — CBC
HCT: 31.5 % — ABNORMAL LOW (ref 36.0–46.0)
HEMOGLOBIN: 10.2 g/dL — AB (ref 12.0–15.0)
MCH: 26.5 pg (ref 26.0–34.0)
MCHC: 32.4 g/dL (ref 30.0–36.0)
MCV: 81.8 fL (ref 78.0–100.0)
Platelets: 188 10*3/uL (ref 150–400)
RBC: 3.85 MIL/uL — AB (ref 3.87–5.11)
RDW: 14.5 % (ref 11.5–15.5)
WBC: 21.9 10*3/uL — AB (ref 4.0–10.5)

## 2016-01-23 LAB — RPR: RPR Ser Ql: NONREACTIVE

## 2016-01-23 MED ORDER — OXYCODONE HCL 5 MG PO TABS: 5.0000 mg | ORAL_TABLET | ORAL | Status: DC | PRN

## 2016-01-23 MED ORDER — ONDANSETRON HCL 4 MG/2ML IJ SOLN: 4.0000 mg | INTRAMUSCULAR | Status: DC | PRN

## 2016-01-23 MED ORDER — IBUPROFEN 600 MG PO TABS
600.0000 mg | ORAL_TABLET | Freq: Four times a day (QID) | ORAL | Status: DC
  Administered 2016-01-23 – 2016-01-24 (×6): 600 mg via ORAL
  Filled 2016-01-23 (×6): qty 1

## 2016-01-23 MED ORDER — COCONUT OIL OIL
1.0000 "application " | TOPICAL_OIL | Status: DC | PRN
  Filled 2016-01-23: qty 120

## 2016-01-23 MED ORDER — ACETAMINOPHEN 325 MG PO TABS
650.0000 mg | ORAL_TABLET | ORAL | Status: DC | PRN
  Administered 2016-01-23: 650 mg via ORAL
  Filled 2016-01-23: qty 2

## 2016-01-23 MED ORDER — ONDANSETRON HCL 4 MG PO TABS: 4.0000 mg | ORAL_TABLET | ORAL | Status: DC | PRN

## 2016-01-23 MED ORDER — DIBUCAINE 1 % RE OINT: 1.0000 "application " | TOPICAL_OINTMENT | RECTAL | Status: DC | PRN

## 2016-01-23 MED ORDER — ZOLPIDEM TARTRATE 5 MG PO TABS: 5.0000 mg | ORAL_TABLET | Freq: Every evening | ORAL | Status: DC | PRN

## 2016-01-23 MED ORDER — OXYCODONE HCL 5 MG PO TABS: 10.0000 mg | ORAL_TABLET | ORAL | Status: DC | PRN

## 2016-01-23 MED ORDER — LACTATED RINGERS IV SOLN: INTRAVENOUS | Status: DC

## 2016-01-23 MED ORDER — SENNOSIDES-DOCUSATE SODIUM 8.6-50 MG PO TABS
2.0000 | ORAL_TABLET | ORAL | Status: DC
  Administered 2016-01-23: 2 via ORAL
  Filled 2016-01-23: qty 2

## 2016-01-23 MED ORDER — PRENATAL MULTIVITAMIN CH
1.0000 | ORAL_TABLET | Freq: Every day | ORAL | Status: DC
  Administered 2016-01-23 – 2016-01-24 (×2): 1 via ORAL
  Filled 2016-01-23 (×2): qty 1

## 2016-01-23 MED ORDER — DIPHENHYDRAMINE HCL 25 MG PO CAPS: 25.0000 mg | ORAL_CAPSULE | Freq: Four times a day (QID) | ORAL | Status: DC | PRN

## 2016-01-23 MED ORDER — WITCH HAZEL-GLYCERIN EX PADS: 1.0000 "application " | MEDICATED_PAD | CUTANEOUS | Status: DC | PRN

## 2016-01-23 MED ORDER — BENZOCAINE-MENTHOL 20-0.5 % EX AERO: 1.0000 "application " | INHALATION_SPRAY | CUTANEOUS | Status: DC | PRN

## 2016-01-23 MED ORDER — SIMETHICONE 80 MG PO CHEW: 80.0000 mg | CHEWABLE_TABLET | ORAL | Status: DC | PRN

## 2016-01-23 NOTE — Progress Notes (Signed)
Void x 3, doing well.  BF baby on demand

## 2016-01-23 NOTE — Progress Notes (Signed)
Post Partum Day 0 Subjective: no complaints, up ad lib, voiding, tolerating PO and nl lochia, pain controlled  Objective: Blood pressure 100/55, pulse 80, temperature 97.9 F (36.6 C), temperature source Oral, resp. rate 18, height 5\' 2"  (1.575 m), weight 76.658 kg (169 lb), SpO2 98 %.  Physical Exam:  General: alert, cooperative and no distress Lochia: appropriate Uterine Fundus: firm   Recent Labs  01/22/16 1200 01/23/16 0821  HGB 10.8* 10.2*  HCT 32.3* 31.5*    Assessment/Plan: Plan for discharge tomorrow, Breastfeeding and Lactation consult.  Routine care.  Circumcision later today or tomorrow.     LOS: 1 day   Bovard-Stuckert, Titus Drone 01/23/2016, 8:36 AM

## 2016-01-23 NOTE — Progress Notes (Signed)
Assumed care of mom.  Baby wrapped in blankets in mom's arms.  Voided x2 prior to tx.

## 2016-01-23 NOTE — Anesthesia Postprocedure Evaluation (Signed)
Anesthesia Post Note  Patient: Brandi MedicusDeborah C Zendejas  Procedure(s) Performed: * No procedures listed *  Patient location during evaluation: Mother Baby Anesthesia Type: Epidural Level of consciousness: awake and alert and oriented Pain management: satisfactory to patient Vital Signs Assessment: post-procedure vital signs reviewed and stable Respiratory status: spontaneous breathing and nonlabored ventilation Cardiovascular status: stable Postop Assessment: no headache, no backache, no signs of nausea or vomiting, adequate PO intake and patient able to bend at knees (patient up walking) Anesthetic complications: no     Last Vitals:  Filed Vitals:   01/23/16 0245 01/23/16 0345  BP: 105/56 102/55  Pulse: 104 96  Temp: 36.8 C 37.1 C  Resp: 20 18    Last Pain:  Filed Vitals:   01/23/16 0528  PainSc: 0-No pain   Pain Goal: Patients Stated Pain Goal: 5 (01/22/16 1202)               Madison HickmanGREGORY,Chaney Ingram

## 2016-01-23 NOTE — Lactation Note (Signed)
This note was copied from a baby's chart. Lactation Consultation Note  Patient Name: Brandi Mickle PlumbDeborah Hiott WUJWJ'XToday's Date: 01/23/2016 Reason for consult: Initial assessment  Baby 21 hours old. Mom reports that baby is nursing well except that he wants to suck his lower lip. Baby was circumcised earlier at [id number], and mom is holding sleeping baby on her chest. Mom reports that her first child did this as well. Discussed suck training with a clean finger, and flanging the baby's lower lip with chin tug. Mom states that her left nipple is inverted and baby has a little more trouble latching to left side. Mom given manual pump--and states that she used with first child. Enc mom to call for assistance with latching as needed. Enc alternating breast with each breastfeed. Mom aware of OP/BFSG and LC phone line assistance after D/C.  Maternal Data Has patient been taught Hand Expression?: Yes Does the patient have breastfeeding experience prior to this delivery?: Yes  Feeding Feeding Type: Breast Fed Length of feed: 25 min (per mom)  LATCH Score/Interventions Latch: Grasps breast easily, tongue down, lips flanged, rhythmical sucking.  Audible Swallowing: A few with stimulation  Type of Nipple: Everted at rest and after stimulation  Comfort (Breast/Nipple): Soft / non-tender     Hold (Positioning): No assistance needed to correctly position infant at breast.  LATCH Score: 9  Lactation Tools Discussed/Used Pump Review: Setup, frequency, and cleaning Initiated by:: JW Date initiated:: 01/23/16   Consult Status Consult Status: Follow-up Date: 01/24/16 Follow-up type: In-patient    Geralynn OchsWILLIARD, [name] 01/23/2016, 9:17 PM

## 2016-01-24 ENCOUNTER — Encounter (HOSPITAL_COMMUNITY): Payer: Self-pay

## 2016-01-24 MED ORDER — PRENATAL MULTIVITAMIN CH: 1.0000 | ORAL_TABLET | Freq: Every day | ORAL | Status: DC

## 2016-01-24 MED ORDER — IBUPROFEN 800 MG PO TABS: 800.0000 mg | ORAL_TABLET | Freq: Four times a day (QID) | ORAL | Status: DC

## 2016-01-24 NOTE — Progress Notes (Addendum)
Post Partum Day 1 Subjective: no complaints, up ad lib, voiding, tolerating PO and nl lochia, pain controlled  Objective: Blood pressure 93/51, pulse 71, temperature 98.1 F (36.7 C), temperature source Oral, resp. rate 16, height 5\' 2"  (1.575 m), weight 76.658 kg (169 lb), SpO2 100 %, unknown if currently breastfeeding.  Physical Exam:  General: alert and no distress Lochia: appropriate Uterine Fundus: firm  Recent Labs  01/22/16 1200 01/23/16 0821  HGB 10.8* 10.2*  HCT 32.3* 31.5*    Assessment/Plan: Discharge home, Breastfeeding and Lactation consult.  Routine care.  D/c with Motrin and PNV.  F/u 6 weeks   LOS: 2 days   Bovard-Stuckert, Tnia Anglada 01/24/2016, 10:07 AM

## 2016-01-24 NOTE — Lactation Note (Signed)
This note was copied from a baby's chart. Lactation Consultation Note  Patient Name: Boy Mickle PlumbDeborah Droessler ZOXWR'UToday's Date: 01/24/2016 Reason for consult: Follow-up assessment  With thi mom and term baby, now 35 hours  Old and discharge to home today, with a weight check scheduled for tomorrow. Mom asked to see me due to severe nipple pinching and nipple pain with latch. On exam of baby's mouth, his tongue does not extend beyond his gum line, tends to sit behind it, and forms a cup shape with elevation . The tongue frenulum is imbedded in thick tissue.    MOm had the same problem with her first child and ended up with a low milk supply, and breast fed briefly. With this information, I called [facility], to inform theem my findings. I told the parents mom's difficulty with breast feeding was probably caused by some oral restriction of her baby's mouth.  I rented mom a DEP for 2 weeks, and advised her to pump every 3 hours, 15-30 minutes, to protect her milk supply and to provide supplement to breast feeding for the baby. I also made an o/p lactation appointment for Tuesday, 7/25. I also gave mom some oral restriction resources, so they can gather information I also fitted mom with a 24 nipple shiel and with compressing the breast and shiel was able to latch the baby deep enough, to avoid pain for mom, and could see fair breast movement.  Mom knows she can pump and bottle feed, if latching is too painful, and she can call lactation for questions/concerns.    Maternal Data    Feeding Feeding Type: Breast Fed Length of feed: 10 min (on a d off)  LATCH Score/Interventions Latch: Repeated attempts needed to sustain latch, nipple held in mouth throughout feeding, stimulation needed to elicit sucking reflex. (latched with24 nipple shled) Intervention(s): Adjust position;Assist with latch;Breast compression  Audible Swallowing: None Intervention(s): Skin to skin;Hand expression (mom has colostrum, more  on right than kleft - left nipple with many creases) Intervention(s): Skin to skin;Hand expression  Type of Nipple: Everted at rest and after stimulation  Comfort (Breast/Nipple): Engorged, cracked, bleeding, large blisters, severe discomfort Problem noted: Cracked, bleeding, blisters, bruises  Problem noted: Severe discomfort  Hold (Positioning): Assistance needed to correctly position infant at breast and maintain latch. Intervention(s): Breastfeeding basics reviewed;Support Pillows;Position options;Skin to skin  LATCH Score: 4  Lactation Tools Discussed/Used Tools: Nipple Shields Nipple shield size: 24;Other (comment) (good fit) Pump Review: Setup, frequency, and cleaning;Milk Storage;Other (comment) (hand expression reviewed, pump use described) Initiated by:: c Corisa Montini RN IBCLC Date initiated:: 01/24/16   Consult Status Consult Status: Follow-up Date: 02/02/16 Follow-up type: Out-patient    Alfred LevinsLee, Haely Leyland Anne 01/24/2016, 12:09 PM

## 2016-01-24 NOTE — Discharge Summary (Signed)
OB Discharge Summary     Patient Name: Brandi Carter DOB: May 20, 1991 MRN: [id number]  Date of admission: 01/22/2016 Delivering MD: Sherian Rein   Date of discharge: 01/24/2016  Admitting diagnosis: INDUCTION Intrauterine pregnancy: [redacted]w[redacted]d     Secondary diagnosis:  Principal Problem:   SVD (spontaneous vaginal delivery) Active Problems:   Normal pregnancy in multigravida in third trimester  Additional problems: N/A     Discharge diagnosis: Term Pregnancy Delivered                                                                                                Post partum procedures:none  Augmentation: AROM and Pitocin  Complications: None  Hospital course:  Induction of Labor With Vaginal Delivery   25 y.o. yo [id number] at [redacted]w[redacted]d was admitted to the hospital 01/22/2016 for induction of labor.  Indication for induction: Favorable cervix at term.  Patient had an uncomplicated labor course as follows: Membrane Rupture Time/Date: 1:11 PM ,01/22/2016   Intrapartum Procedures: Episiotomy: None [1]                                         Lacerations:  1st degree [2];Perineal [11]  Patient had delivery of a Viable infant.  Information for the patient's newborn:  Brandi, Carter [[id number]]  Delivery Method: Vaginal, Spontaneous Delivery (Filed from Delivery Summary)   01/23/2016  Details of delivery can be found in separate delivery note.  Patient had a routine postpartum course. Patient is discharged home 01/24/2016.   Physical exam  Filed Vitals:   01/23/16 0811 01/23/16 1636 01/23/16 1742 01/24/16 0553  BP: 100/55 103/65 93/56 93/51   Pulse: 80 92 86 71  Temp: 97.9 F (36.6 C) 98 F (36.7 C) 98.1 F (36.7 C) 98.1 F (36.7 C)  TempSrc: Oral Oral Oral Oral  Resp: Height:      Weight:      SpO2: 98% 99% 100%    General: alert and no distress Lochia: appropriate Uterine Fundus: firm  Labs: Lab Results  Component Value Date   WBC 21.9*  01/23/2016   HGB 10.2* 01/23/2016   HCT 31.5* 01/23/2016   MCV 81.8 01/23/2016   PLT 188 01/23/2016   CMP Latest Ref Rng 05/08/2013  Glucose 70 - 99 mg/dL 130(Q)  BUN 6 - 23 mg/dL 9  Creatinine 0.4 - 1.2 mg/dL 0.6  Sodium 657 - 846 mEq/L 140  Potassium 3.5 - 5.1 mEq/L 4.1  Chloride 96 - 112 mEq/L 106  CO2 19 - 32 mEq/L 27  Calcium 8.4 - 10.5 mg/dL 9.5  Total Protein 6.0 - 8.3 g/dL 7.0  Total Bilirubin 0.3 - 1.2 mg/dL 0.5  Alkaline Phos 39 - 117 U/L 51  AST 0 - 37 U/L 20  ALT 0 - 35 U/L 15    Discharge instruction: per After Visit Summary and "Baby and Me Booklet".  After visit meds:    Medication List    TAKE these medications  calcium carbonate 1250 (500 Ca) MG chewable tablet  Commonly known as:  OS-CAL  Chew 2 tablets by mouth daily.     ibuprofen 800 MG tablet  Commonly known as:  ADVIL,MOTRIN  Take 1 tablet (800 mg total) by mouth every 6 (six) hours.     ondansetron 4 MG disintegrating tablet  Commonly known as:  ZOFRAN ODT  Take 1 tablet (4 mg total) by mouth every 6 (six) hours as needed for nausea.     prenatal multivitamin Tabs tablet  Take 1 tablet by mouth daily at 12 noon.     ranitidine 150 MG tablet  Commonly known as:  ZANTAC  Take 150 mg by mouth 2 (two) times daily.        Diet: routine diet  Activity: Advance as tolerated. Pelvic rest for 6 weeks.   Outpatient follow up:6 weeks Follow up Appt:No future appointments. Follow up Visit:No Follow-up on file.  Postpartum contraception: IUD unsure  Newborn Data: Live born female  Birth Weight: 8 lb 5.7 oz (3790 g) APGAR: 9, 9  Baby Feeding: Breast Disposition:home with mother   01/24/2016 Sherian ReinBovard-Stuckert, Jerrit Horen, MD

## 2016-02-02 ENCOUNTER — Ambulatory Visit (HOSPITAL_COMMUNITY): Admit: 2016-02-02 | Payer: BLUE CROSS/BLUE SHIELD

## 2017-12-05 ENCOUNTER — Ambulatory Visit: Payer: BLUE CROSS/BLUE SHIELD | Admitting: Family Medicine

## 2017-12-07 ENCOUNTER — Encounter: Payer: Self-pay | Admitting: Internal Medicine

## 2017-12-07 ENCOUNTER — Telehealth: Payer: Self-pay

## 2017-12-07 ENCOUNTER — Ambulatory Visit (INDEPENDENT_AMBULATORY_CARE_PROVIDER_SITE_OTHER): Payer: BLUE CROSS/BLUE SHIELD | Admitting: Internal Medicine

## 2017-12-07 DIAGNOSIS — F17210 Nicotine dependence, cigarettes, uncomplicated: Secondary | ICD-10-CM | POA: Diagnosis not present

## 2017-12-07 DIAGNOSIS — R509 Fever, unspecified: Secondary | ICD-10-CM | POA: Diagnosis not present

## 2017-12-07 NOTE — Progress Notes (Signed)
[facility]  Reason for Consult: Recurrent respiratory infections Referring Provider: Mitzi Hansen, FNP  Assessment: I suspect that her recent respiratory illnesses are a result of bad luck and being a cigarette smoker rather than any underlying immunosuppressive disorder.  She will complete levofloxacin tomorrow.  I will check blood work today and call her once the results are available.  Plan: 1. Complete levofloxacin and observe off of antibiotics 2. CBC, CMP, HIV antibody, CMV and EBV serologies  Patient Active Problem List   Diagnosis Date Noted  . Fever 12/07/2017    Priority: High  . Cigarette smoker 12/07/2017  . Allergic rhinitis 05/08/2013    Patient's Medications  New Prescriptions   No medications on file  Previous Medications   CALCIUM CARBONATE (OS-CAL) 1250 (500 CA) MG CHEWABLE TABLET    Chew 2 tablets by mouth daily.   IBUPROFEN (ADVIL,MOTRIN) 800 MG TABLET    Take 1 tablet (800 mg total) by mouth every 6 (six) hours.   LEVOFLOXACIN (LEVAQUIN) 500 MG TABLET    TK 1 T PO QD   NUVARING 0.12-0.015 MG/24HR VAGINAL RING       ONDANSETRON (ZOFRAN ODT) 4 MG DISINTEGRATING TABLET    Take 1 tablet (4 mg total) by mouth every 6 (six) hours as needed for nausea.   PRENATAL VIT-FE FUMARATE-FA (PRENATAL MULTIVITAMIN) TABS TABLET    Take 1 tablet by mouth daily at 12 noon.   RANITIDINE (ZANTAC) 150 MG TABLET    Take 150 mg by mouth 2 (two) times daily.  Modified Medications   No medications on file  Discontinued Medications   No medications on file    HPI: Brandi Carter is a 27 y.o. female who had been in good health until February of this year.  She felt like she developed a "cold".  She developed nausea, vomiting and fever and felt like she had the flu.  Influenza testing was negative but she was treated with oseltamivir anyway.  She felt a little bit better but then developed similar symptoms in March.  She also had severe sore  throat and swollen glands in her neck.  Testing for strep throat was negative but she was treated with cefdinir.  She continued to feel exhausted.  She seemed to get a little bit better but then developed fever again in April along with recurrent sore throat, cervical adenopathy and a rash that appeared on her arms neck and stomach.  She says that testing for strep and mononucleosis were negative.  She received a cortisone shot and was treated with amoxicillin.  The rash and fever resolved quickly.  Her sore throat improved but she remained exhausted.  Earlier this month she developed cough.  She was seen again on 11/29/2017 and a chest x-ray revealed a right middle lobe infiltrate.  She was started on levofloxacin and will be completing 10 days of therapy tomorrow.  She is feeling better but she is very worried about why she would have recurrent illnesses like this.  She says she has been worried that she might have cancer or thyroid problems.  She does smoke cigarettes.  She has seasonal allergies.  She is currently separated from her husband.  She has 2 children ages 59 and 54 years old.  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  She has not had any sick contacts that she knows of.  She has not traveled recently.  She  has no history of sexually transmitted diseases or hepatitis.  She believes she was tested negative for HIV during both of her pregnancies.  Review of Systems: Review of Systems  Constitutional: Positive for fever, malaise/fatigue and weight loss. Negative for chills and diaphoresis.       She says that she lost 10 pounds earlier this spring unintentionally but has gained most of that back.  HENT: Positive for congestion and sore throat.   Respiratory: Positive for cough. Negative for sputum production and shortness of breath.   Cardiovascular: Negative for chest pain.  Gastrointestinal: Positive for diarrhea and nausea. Negative for abdominal pain, heartburn and  vomiting.  Genitourinary: Negative for dysuria and frequency.  Musculoskeletal: Negative for joint pain and myalgias.  Skin: Positive for rash.  Neurological: Negative for dizziness and headaches.  Psychiatric/Behavioral: Negative for depression and substance abuse. The patient is nervous/anxious.       Past Medical History:  Diagnosis Date  . Alcohol abuse    As a teenager  . Chicken pox   . Frequent headaches   . Infection    UTI  . Migraines   . Normal pregnancy in multigravida in third trimester 01/21/2016  . SVD (spontaneous vaginal delivery) 01/23/2016    Social History   Tobacco Use  . Smoking status: Former Games developer  . Smokeless tobacco: Never Used  . Tobacco comment: quit with preg  Substance Use Topics  . Alcohol use: No  . Drug use: No    Family History  Problem Relation Age of Onset  . Alcohol abuse Father   . High blood pressure Father   . Emotional abuse Father   . Schizophrenia Father   . Alcohol abuse Paternal Grandfather   . Cancer Paternal Grandfather   . Bipolar disorder Mother   . Fibromyalgia Paternal Aunt    No Known Allergies  OBJECTIVE: Vitals:   12/07/17 0937  BP: 115/79  Pulse: 94  Temp: 97.9 F (36.6 C)  TempSrc: Oral  Weight: 113 lb 12.8 oz (51.6 kg)   Body mass index is 20.81 kg/m.   Physical Exam  Constitutional: She is oriented to person, place, and time.  She is very pleasant but slightly anxious.  HENT:  Mouth/Throat: Oropharynx is clear and moist. No oropharyngeal exudate.  Eyes: Conjunctivae are normal.  Neck: Neck supple. No thyromegaly present.  Cardiovascular: Normal rate, regular rhythm and normal heart sounds.  No murmur heard. Pulmonary/Chest: Effort normal and breath sounds normal.  Abdominal: Soft. She exhibits no distension and no mass. There is no tenderness.  Musculoskeletal: Normal range of motion. She exhibits no edema or tenderness.  Lymphadenopathy:       Head (right side): No submandibular  adenopathy present.       Head (left side): No submandibular adenopathy present.    She has no cervical adenopathy.    She has no axillary adenopathy.  She has an inflamed hair follicle in her left axilla.  Slightly tender to palpation.  It is not fluctuant.  There is no overlying cellulitis.  Neurological: She is alert and oriented to person, place, and time.  Skin: No rash noted.  Psychiatric: She has a normal mood and affect.    Microbiology: No results found for this or any previous visit (from the past 240 hour(s)).  Cliffton Asters, MD [facility] [phone] pager   [phone] cell 12/07/2017, 10:40 AM

## 2017-12-07 NOTE — Telephone Encounter (Signed)
Rn informed me that pt was unable to make a f/u appointment in a month unless it was after 2 pm. Dr. Orvan Falconer will not be in clinic during PM hours, but stated he would call pt back with lab results. If pt does require an appt we schedule an appt over the phone, Pt was informed and stated that she is okay with this. Lorenso Courier, [place]

## 2017-12-08 ENCOUNTER — Ambulatory Visit: Payer: BLUE CROSS/BLUE SHIELD | Admitting: Family Medicine

## 2017-12-08 LAB — EPSTEIN-BARR VIRUS VCA ANTIBODY PANEL
EBV NA IgG: 290 U/mL — ABNORMAL HIGH
EBV VCA IgG: 728 U/mL — ABNORMAL HIGH

## 2017-12-08 LAB — COMPREHENSIVE METABOLIC PANEL
AG RATIO: 1.5 (calc) (ref 1.0–2.5)
ALT: 10 U/L (ref 6–29)
AST: 12 U/L (ref 10–30)
Albumin: 4.1 g/dL (ref 3.6–5.1)
Alkaline phosphatase (APISO): 63 U/L (ref 33–115)
BILIRUBIN TOTAL: 0.3 mg/dL (ref 0.2–1.2)
BUN: 14 mg/dL (ref 7–25)
CALCIUM: 9.4 mg/dL (ref 8.6–10.2)
CHLORIDE: 106 mmol/L (ref 98–110)
CO2: 27 mmol/L (ref 20–32)
Creat: 0.72 mg/dL (ref 0.50–1.10)
GLOBULIN: 2.8 g/dL (ref 1.9–3.7)
Glucose, Bld: 74 mg/dL (ref 65–99)
Potassium: 4.3 mmol/L (ref 3.5–5.3)
SODIUM: 141 mmol/L (ref 135–146)
TOTAL PROTEIN: 6.9 g/dL (ref 6.1–8.1)

## 2017-12-08 LAB — CBC
HEMATOCRIT: 43.3 % (ref 35.0–45.0)
HEMOGLOBIN: 14.4 g/dL (ref 11.7–15.5)
MCH: 29 pg (ref 27.0–33.0)
MCHC: 33.3 g/dL (ref 32.0–36.0)
MCV: 87.3 fL (ref 80.0–100.0)
MPV: 10.6 fL (ref 7.5–12.5)
Platelets: 290 10*3/uL (ref 140–400)
RBC: 4.96 10*6/uL (ref 3.80–5.10)
RDW: 12.1 % (ref 11.0–15.0)
WBC: 7.1 10*3/uL (ref 3.8–10.8)

## 2017-12-08 LAB — EPSTEIN-BARR VIRUS EARLY D ANTIGEN ANTIBODY, IGG

## 2017-12-08 LAB — CMV IGM: CMV IgM: <30 AU/mL

## 2017-12-08 LAB — HIV ANTIBODY (ROUTINE TESTING W REFLEX): HIV 1&2 Ab, 4th Generation: NONREACTIVE

## 2017-12-08 LAB — TSH: TSH: 1.73 m[IU]/L

## 2017-12-11 ENCOUNTER — Emergency Department (HOSPITAL_COMMUNITY): Payer: BLUE CROSS/BLUE SHIELD

## 2017-12-11 ENCOUNTER — Emergency Department (HOSPITAL_COMMUNITY)
Admission: EM | Admit: 2017-12-11 | Discharge: 2017-12-11 | Disposition: A | Discharge status: Home / Self Care | Payer: BLUE CROSS/BLUE SHIELD | Attending: Emergency Medicine | Admitting: Emergency Medicine

## 2017-12-11 ENCOUNTER — Other Ambulatory Visit: Payer: Self-pay

## 2017-12-11 ENCOUNTER — Encounter (HOSPITAL_COMMUNITY): Payer: Self-pay | Admitting: Emergency Medicine

## 2017-12-11 ENCOUNTER — Encounter: Payer: Self-pay | Admitting: Internal Medicine

## 2017-12-11 DIAGNOSIS — J029 Acute pharyngitis, unspecified: Secondary | ICD-10-CM | POA: Diagnosis present

## 2017-12-11 DIAGNOSIS — F17210 Nicotine dependence, cigarettes, uncomplicated: Secondary | ICD-10-CM | POA: Diagnosis not present

## 2017-12-11 DIAGNOSIS — E041 Nontoxic single thyroid nodule: Secondary | ICD-10-CM | POA: Diagnosis not present

## 2017-12-11 DIAGNOSIS — J3501 Chronic tonsillitis: Secondary | ICD-10-CM | POA: Insufficient documentation

## 2017-12-11 MED ORDER — SODIUM CHLORIDE 0.9 % IV BOLUS
1000.0000 mL | Freq: Once | INTRAVENOUS | Status: AC
  Administered 2017-12-11: 1000 mL via INTRAVENOUS

## 2017-12-11 MED ORDER — IOHEXOL 300 MG/ML  SOLN
75.0000 mL | Freq: Once | INTRAMUSCULAR | Status: AC | PRN
  Administered 2017-12-11: 75 mL via INTRAVENOUS

## 2017-12-11 NOTE — ED Notes (Signed)
No rx given 

## 2017-12-11 NOTE — ED Notes (Signed)
Patient transported to CT 

## 2017-12-11 NOTE — ED Triage Notes (Signed)
Pt reports that she has swollen tonsils and sore throat for couple days. [facility] and had negative strep but started on Augmentin. Was told could be starting tonsil abscess and to go to ED if getting worse. Reports now has swelling on left side.

## 2017-12-11 NOTE — Discharge Instructions (Addendum)
Call the ENT for follow up  You will need an ultrasound of your thyroid in follow-up  Please return to the emergency department for any new or worsening symptoms

## 2017-12-12 NOTE — ED Provider Notes (Signed)
[facility]-EMERGENCY DEPT Provider Note   CSN: [id number] Arrival date & time: 12/11/17  1027     History   Chief Complaint Chief Complaint  Patient presents with  . Oral Swelling    HPI Brandi Carter is a 27 y.o. female.  HPI 27 yo female with a hx of recurrent throat infections currently on abx presents with several days of worsening sore throat.  Seen recently at urgent care and started on antibiotics and was told that if her throat were to worsen she would need to come to the emergency department for evaluation for possible deep space infection/peritonsillar abscess.  She denies fever but reports some chills.  Painful swallowing.  No anterior neck pain.  She states recurrent infections of her throat and overall generalized malaise and fatigue.  She was seen in infectious disease earlier this week and labs including CMV were performed.  HIV was negative as well.  She is still unclear as to why her energy levels are so low.  She reports she is recently had her thyroid levels checked as well and they were normal.  She has not seen ENT for follow-up yet.  Symptoms are moderate in severity.   Past Medical History:  Diagnosis Date  . Alcohol abuse    As a teenager  . Chicken pox   . Frequent headaches   . Infection    UTI  . Migraines   . Normal pregnancy in multigravida in third trimester 01/21/2016  . SVD (spontaneous vaginal delivery) 01/23/2016    Patient Active Problem List   Diagnosis Date Noted  . Fever 12/07/2017  . Cigarette smoker 12/07/2017  . Allergic rhinitis 05/08/2013    Past Surgical History:  Procedure Laterality Date  . NO PAST SURGERIES       OB History    Gravida  5   Para  2   Term  2   Preterm  0   AB  3   Living  2     SAB  3   TAB  0   Ectopic  0   Multiple  0   Live Births  2            Home Medications    Prior to Admission medications   Medication Sig Start Date End Date Taking? Authorizing  Provider  amoxicillin-clavulanate (AUGMENTIN) 875-125 MG tablet Take 1 tablet by mouth 2 (two) times daily. 12/10/17  Yes [provider]  NUVARING 0.12-0.015 MG/24HR vaginal ring Place 1 each vaginally every 28 (twenty-eight) days.  11/13/17  Yes [provider]    Family History Family History  Problem Relation Age of Onset  . Alcohol abuse Father   . High blood pressure Father   . Emotional abuse Father   . Schizophrenia Father   . Alcohol abuse Paternal Grandfather   . Cancer Paternal Grandfather   . Bipolar disorder Mother   . Fibromyalgia Paternal Aunt     Social History Social History   Tobacco Use  . Smoking status: Current Every Day Smoker    Types: Cigarettes  . Smokeless tobacco: Never Used  Substance Use Topics  . Alcohol use: No  . Drug use: No     Allergies   Patient has no known allergies.   Review of Systems Review of Systems  All other systems reviewed and are negative.    Physical Exam Updated Vital Signs BP 103/65 (BP Location: Left Arm)   Pulse 79   Temp  98.4 F (36.9 C) (Oral)   Resp 14   Ht 5\' 2"  (1.575 m)   Wt 51.3 kg (113 lb)   LMP 12/07/2017   SpO2 98%   BMI 20.67 kg/m   Physical Exam  Constitutional: She is oriented to person, place, and time. She appears well-developed and well-nourished.  HENT:  Head: Normocephalic.  Right and left TM normal. Posterior pharyngeal erythema with mild exudates. Uvula midline. Tongue normal. Dentition normal. Tolerating secretions  Eyes: EOM are normal.  Neck: Normal range of motion. Neck supple. No thyromegaly present.  Cardiovascular: Normal rate and regular rhythm.  Pulmonary/Chest: Effort normal and breath sounds normal.  Abdominal: She exhibits no distension.  Musculoskeletal: Normal range of motion.  Lymphadenopathy:    She has no cervical adenopathy.  Neurological: She is alert and oriented to person, place, and time.  Psychiatric: She has a normal mood and affect.    Nursing note and vitals reviewed.    ED Treatments / Results  Labs (all labs ordered are listed, but only abnormal results are displayed) Labs Reviewed - No data to display  EKG None  Radiology Ct Soft Tissue Neck W Contrast  Result Date: 12/11/2017 CLINICAL DATA:  27 year old female with sore throat and tonsillar swelling for several days, started on Augmentin. Worsening left side swelling, right greater than left throat pain. EXAM: CT NECK WITH CONTRAST TECHNIQUE: Multidetector CT imaging of the neck was performed using the standard protocol following the bolus administration of intravenous contrast. CONTRAST:  75mL OMNIPAQUE IOHEXOL 300 MG/ML  SOLN COMPARISON:  None. FINDINGS: Pharynx and larynx: Motion artifact at the level of the larynx which appears to remain normal. Negative hypopharynx. Mild symmetric appearing palatine tonsil enlargement with mild hyperenhancement, but no discrete tonsillar lesion. The adenoids appear within normal limits. The parapharyngeal spaces are normal. The retropharyngeal space is normal. Salivary glands: Negative sublingual space, submandibular glands, and parotid glands. Thyroid: Right lobe 10-11 millimeter mixed density nodule (series 2, image 68). Otherwise negative. Lymph nodes: Bilateral level 2 lymph nodes are at the upper limits of normal to mildly increased in size and enhancement. Still, nodes remain 10 millimeters or smaller by short axis. No cystic or necrotic nodes. The other bilateral cervical nodal stations appear normal. Vascular: The major vascular structures in the neck and at the skull base are patent, including both internal jugular veins (the right is dominant). Limited intracranial: Negative. Visualized orbits: Negative. Mastoids and visualized paranasal sinuses: Mucous retention cyst in the right maxillary sinus. Mild anterior ethmoid mucosal thickening greater on the right. Tympanic cavities and mastoids are clear. Skeleton: Negative. Upper  chest: Normal visible superior mediastinum. Normal upper lungs. IMPRESSION: 1. Negative for tonsillar or neck abscess. Upper limits of normal to reactive appearing palatine tonsils and level 2 lymph nodes compatible with URI. 2. Small mixed density right thyroid nodule meets consensus criteria for routine Thyroid Ultrasound follow-up. 3. Mild paranasal sinus inflammation. Superimposed right maxillary sinus mucous retention cyst. Electronically Signed   By: Odessa Fleming M.D.   On: 12/11/2017 13:28    Procedures Procedures (including critical care time)  Medications Ordered in ED Medications  sodium chloride 0.9 % bolus 1,000 mL (0 mLs Intravenous Stopped 12/11/17 1334)  iohexol (OMNIPAQUE) 300 MG/ML solution 75 mL (75 mLs Intravenous Contrast Given 12/11/17 1259)     Initial Impression / Assessment and Plan / ED Course  I have reviewed the triage vital signs and the nursing notes.  Pertinent labs & imaging results that were available during  my care of the patient were reviewed by me and considered in my medical decision making (see chart for details).     Well appearing. CT without evidence of PTA or deep space infection. Pt understands importance of follow up US of thyroid for thyroid nodule. ENT follow up. May be considered for tonsillectomy.  Dc home. ENT follow up  Final Clinical Impressions(s) / ED Diagnoses   Final diagnoses:  Chronic tonsillitis  Thyroid nodule    ED Discharge Orders    None       Azalia Bilis, MD 12/12/17 [phone]

## 2017-12-15 ENCOUNTER — Other Ambulatory Visit: Payer: Self-pay | Admitting: Otolaryngology

## 2017-12-15 DIAGNOSIS — J3501 Chronic tonsillitis: Secondary | ICD-10-CM

## 2017-12-15 DIAGNOSIS — F17200 Nicotine dependence, unspecified, uncomplicated: Secondary | ICD-10-CM

## 2017-12-15 DIAGNOSIS — E041 Nontoxic single thyroid nodule: Secondary | ICD-10-CM

## 2017-12-19 ENCOUNTER — Ambulatory Visit: Payer: BLUE CROSS/BLUE SHIELD | Admitting: Family Medicine

## 2017-12-29 ENCOUNTER — Other Ambulatory Visit: Payer: BLUE CROSS/BLUE SHIELD

## 2020-05-11 IMAGING — CT CT NECK W/ CM
4 of 5 series · 16 of 33 positions shown, 18 images · IV contrast (ISOVUE)
Comparison: None.

CLINICAL DATA: 26-year-old female with sore throat and tonsillar
swelling for several days, started on Augmentin. Worsening left side
swelling, right greater than left throat pain.

EXAM:
CT NECK WITH CONTRAST
TECHNIQUE: Multidetector CT imaging of the neck was performed using the
standard protocol following the bolus administration of intravenous
contrast.
CONTRAST:  75mL OMNIPAQUE IOHEXOL 300 MG/ML  SOLN

[Series 2: axial neck · axial · 0.38mm/px · z∈[+1470,+1586]mm · 4 of 98 slices shown, 5 images]
[im 20/98  soft-tissue]
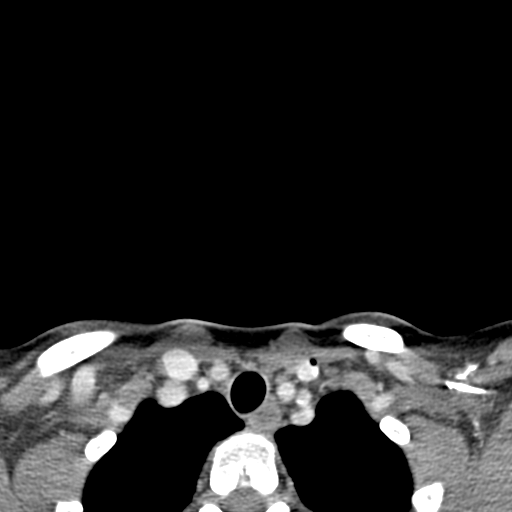
[im 20/98  bone]
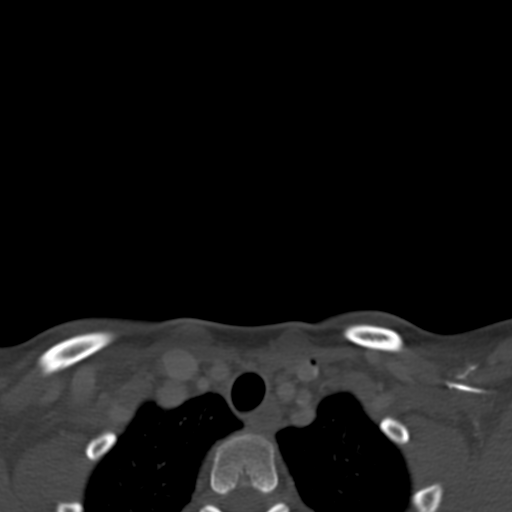
[im 39/98  bone]
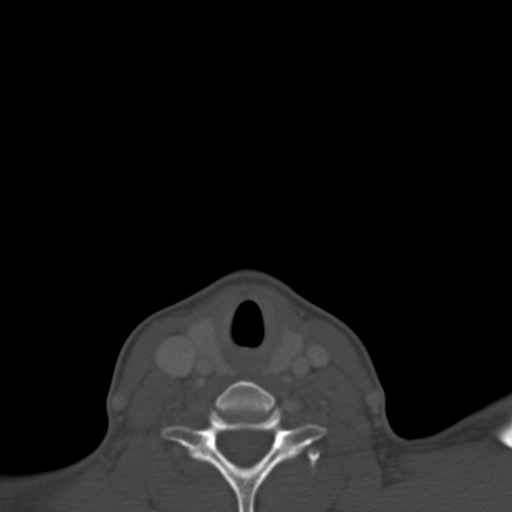
[im 59/98  bone]
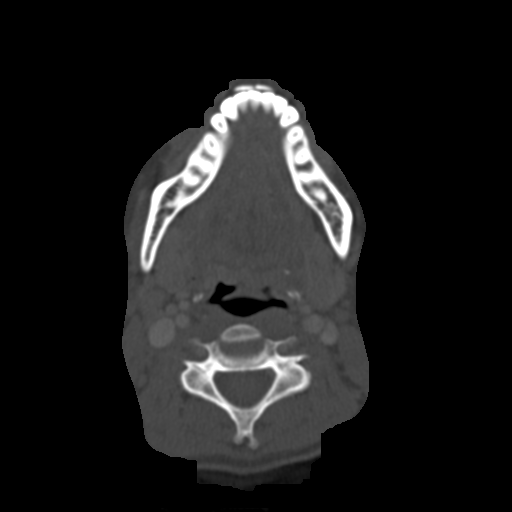
[im 78/98  bone]
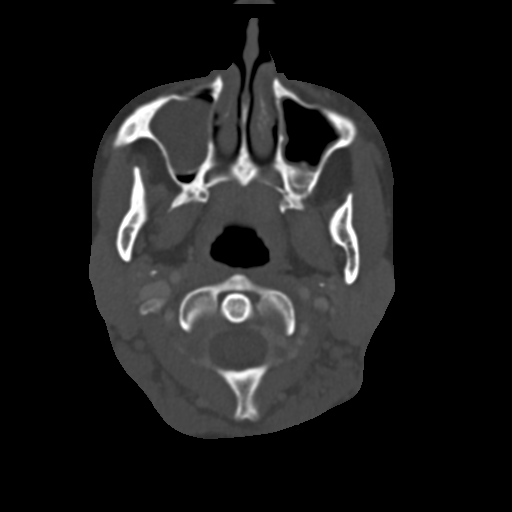

[Series 6: orthogonal ax · axial · 0.39mm/px · z∈[+1427,+1548]mm · 4 of 110 slices shown]
[im 22/110  bone]
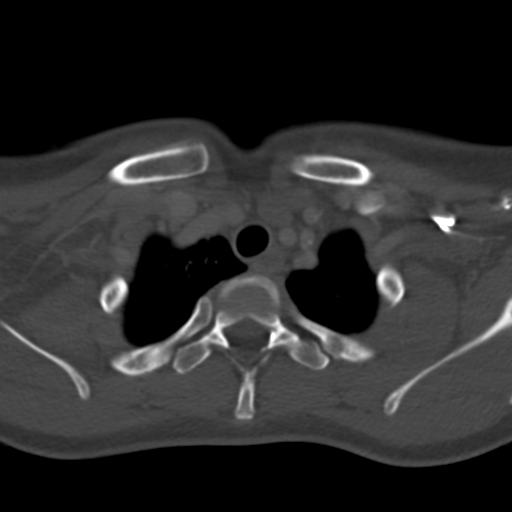
[im 44/110  bone]
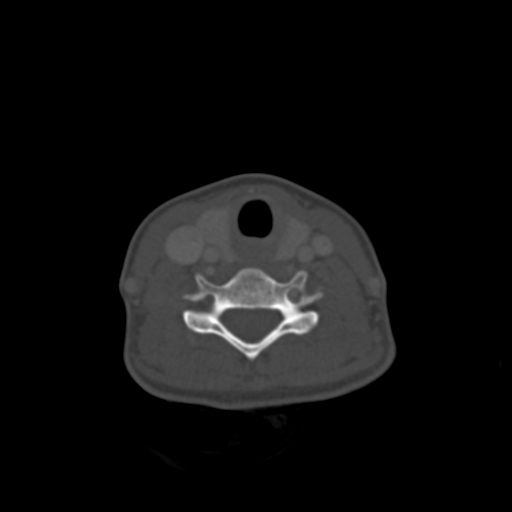
[im 66/110  bone]
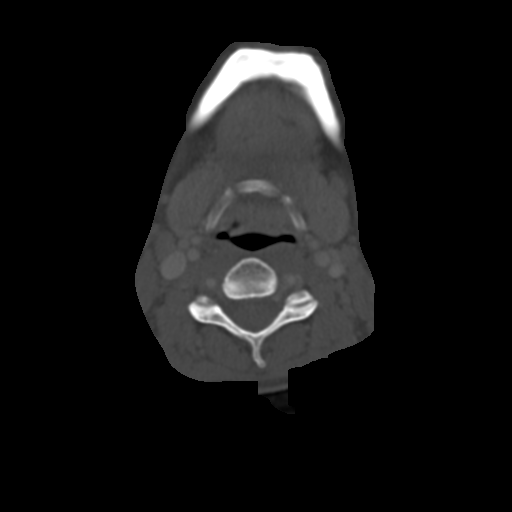
[im 88/110  bone]
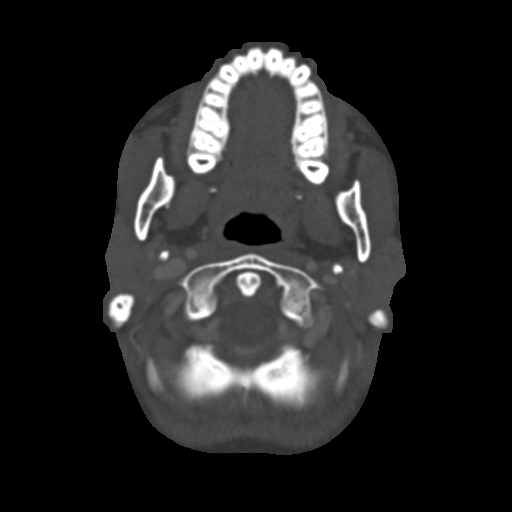

[Series 7: cor neck · coronal · 0.42mm/px · 3 of 101 slices shown]
[im 30/101  bone]
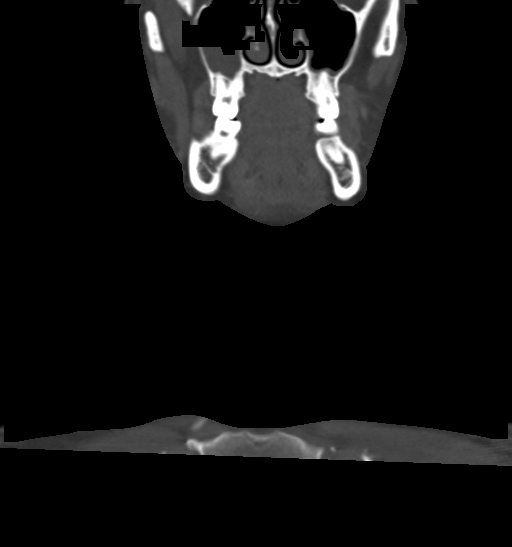
[im 44/101  bone]
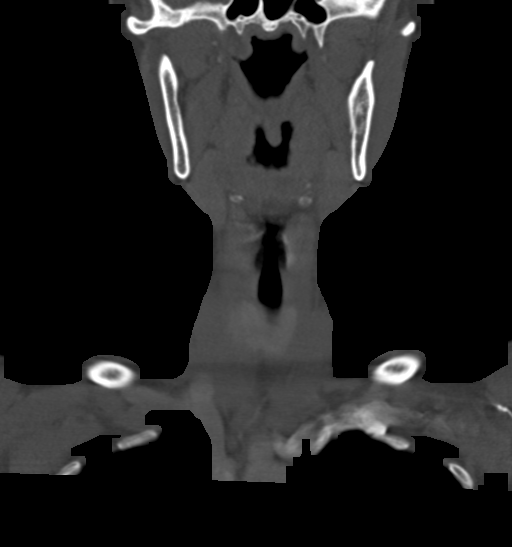
[im 58/101  bone]
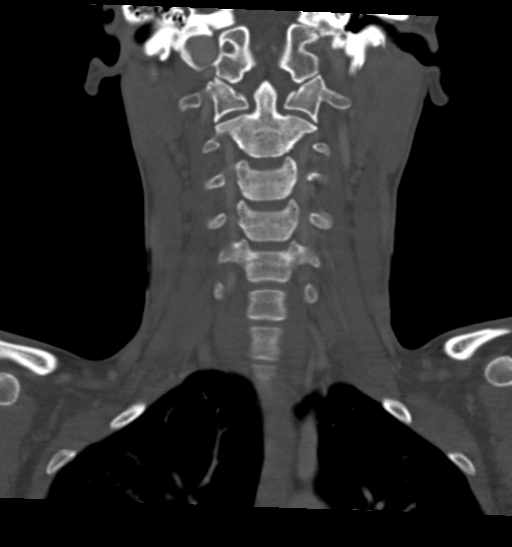

[Series 8: sag neck · sagittal · 0.42mm/px · 5 of 101 slices shown, 6 images]
[im 34/101  bone]
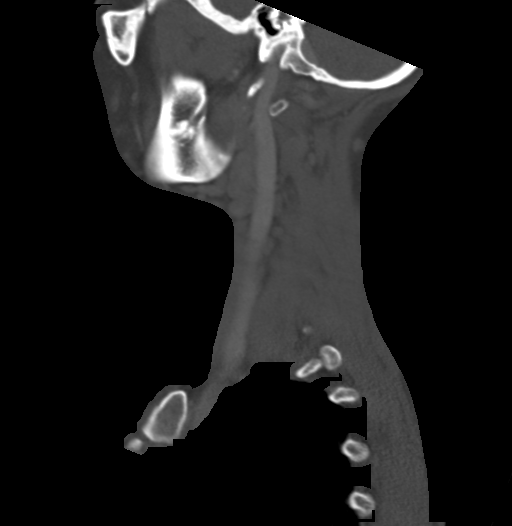
[im 42/101  bone]
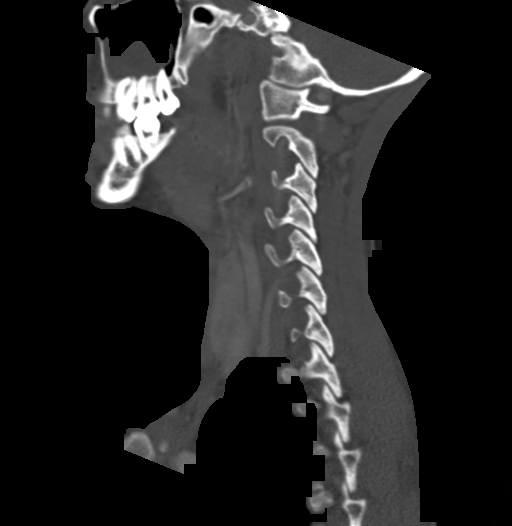
[im 51/101  soft-tissue]
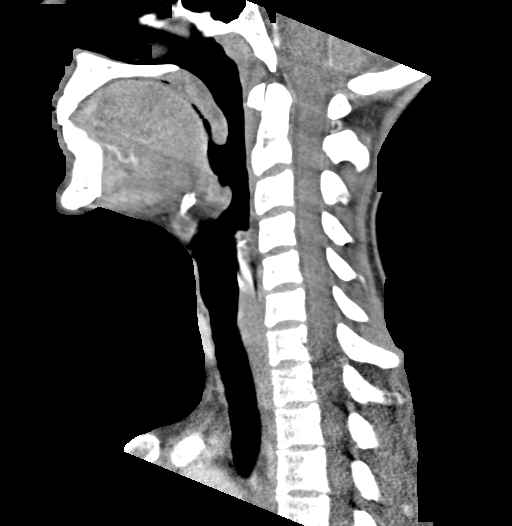
[im 51/101  bone]
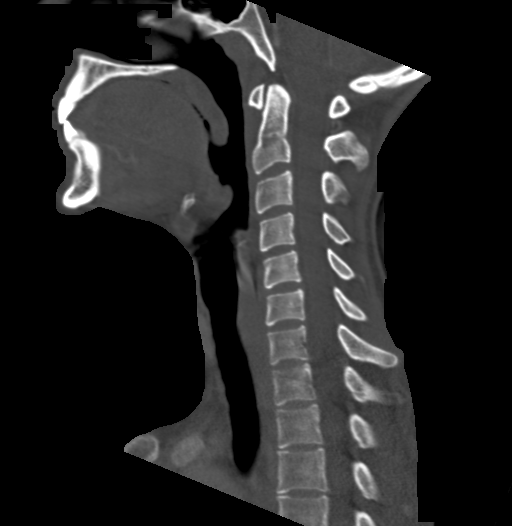
[im 59/101  bone]
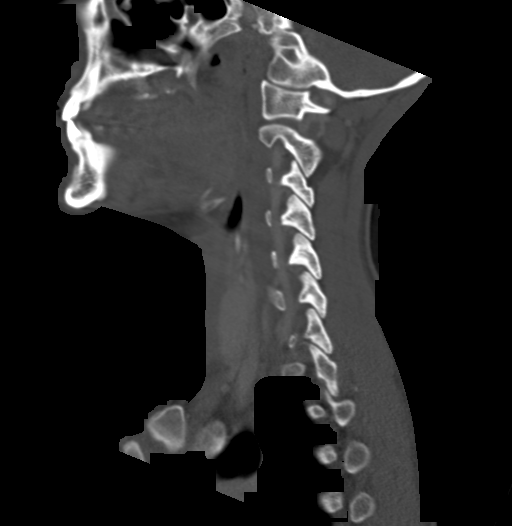
[im 67/101  bone]
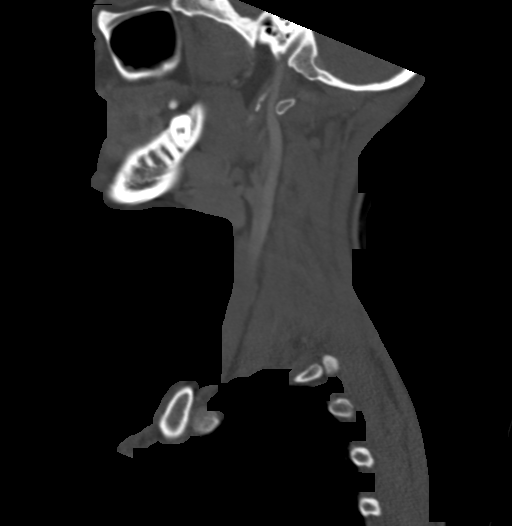

[16 of 33 positions shown; findings below may reference images not displayed]

FINDINGS: Pharynx and larynx: Motion artifact at the level of the larynx which
appears to remain normal.

Negative hypopharynx.

Mild symmetric appearing palatine tonsil enlargement with mild
hyperenhancement, but no discrete tonsillar lesion. The adenoids
appear within normal limits.

The parapharyngeal spaces are normal. The retropharyngeal space is
normal.

Salivary glands: Negative sublingual space, submandibular glands,
and parotid glands.

Thyroid: Right lobe 10-11 millimeter mixed density nodule (series 2,
image 68). Otherwise negative.

Lymph nodes:

Bilateral level 2 lymph nodes are at the upper limits of normal to
mildly increased in size and enhancement. Still, nodes remain 10
millimeters or smaller by short axis. No cystic or necrotic nodes.
The other bilateral cervical nodal stations appear normal.

Vascular: The major vascular structures in the neck and at the skull
base are patent, including both internal jugular veins (the right is
dominant).

Limited intracranial: Negative.

Visualized orbits: Negative.

Mastoids and visualized paranasal sinuses: Mucous retention cyst in
the right maxillary sinus. Mild anterior ethmoid mucosal thickening
greater on the right. Tympanic cavities and mastoids are clear.

Skeleton: Negative.

Upper chest: Normal visible superior mediastinum. Normal upper
lungs.
IMPRESSION: 1. Negative for tonsillar or neck abscess. Upper limits of normal to
reactive appearing palatine tonsils and level 2 lymph nodes
compatible with URI.
2. Small mixed density right thyroid nodule meets consensus criteria
for routine Thyroid Ultrasound follow-up.
3. Mild paranasal sinus inflammation. Superimposed right maxillary
sinus mucous retention cyst.

## 2020-11-02 LAB — HM PAP SMEAR: HM Pap smear: POSITIVE

## 2023-03-07 LAB — OB RESULTS CONSOLE GC/CHLAMYDIA
Chlamydia: NEGATIVE
Neisseria Gonorrhea: NEGATIVE

## 2023-07-12 NOTE — L&D Delivery Note (Addendum)
 [facility]N Faculty Practice Delivery Note  IAN CAVEY is a 33 y.o. [id number] s/p SVD at [redacted]w[redacted]d.  ROM: 11h 37m with clear fluid GBS Status: Negative/-- (02/25 0000) Maximum Maternal Temperature: Temp (24hrs), Avg:97.9 F (36.6 C), Min:97.9 F (36.6 C), Max:98 F (36.7 C)  Delivery Date/Time: 09/27/23 0306 Delivery: Called to room for standby. Patient found to have reducible lip. Had urge to push and babe with deep variables. Delivered quickly with excellent maternal effort. Head delivered OA to LOA. Shoulders and body delivered without difficulty. Left arm and left leg cords present which were reduced after delivery. Babe with spontaneous cry and placed on patient's chest. Dr. Reina Fuse then entered room and took over care of patient.  Infant: baby girl Elizabeth Sauer, MD [facility] Family Medicine Fellow, [facility] for [facility], [facility] 09/27/2023, 3:14 AM  I presented to room immediately after delivery - baby crying on mother's chest. Cord was then clamped and cut by FOB. Cord blood obtained, 3VC. Baby had a vigorous spontaneous cry noted. Placenta then delivered at 0316 intact. Fundal massage performed and pitocin per protocol. Fundus firm. The following lacerations were noted: NONE, EBL 250cc. Mother and baby stable. Counts correct   Infant time: 0306 Gender: female Placenta time: 0316 Apgars: 9/9 Weight: pending skin-to-skin

## 2023-07-20 LAB — OB RESULTS CONSOLE RUBELLA ANTIBODY, IGM: Rubella: IMMUNE

## 2023-07-20 LAB — OB RESULTS CONSOLE HIV ANTIBODY (ROUTINE TESTING): HIV: NONREACTIVE

## 2023-07-20 LAB — OB RESULTS CONSOLE HEPATITIS B SURFACE ANTIGEN: Hepatitis B Surface Ag: NEGATIVE

## 2023-07-20 LAB — OB RESULTS CONSOLE RPR: RPR: NONREACTIVE

## 2023-07-27 ENCOUNTER — Inpatient Hospital Stay (HOSPITAL_BASED_OUTPATIENT_CLINIC_OR_DEPARTMENT_OTHER)
Admission: EM | Admit: 2023-07-27 | Discharge: 2023-07-30 | DRG: 833 | Disposition: A | Discharge status: Home / Self Care | Payer: Medicaid Other | Attending: Obstetrics and Gynecology | Admitting: Obstetrics and Gynecology

## 2023-07-27 ENCOUNTER — Inpatient Hospital Stay (HOSPITAL_COMMUNITY): Payer: Medicaid Other

## 2023-07-27 ENCOUNTER — Encounter (HOSPITAL_BASED_OUTPATIENT_CLINIC_OR_DEPARTMENT_OTHER): Payer: Self-pay

## 2023-07-27 ENCOUNTER — Other Ambulatory Visit: Payer: Self-pay

## 2023-07-27 DIAGNOSIS — O26893 Other specified pregnancy related conditions, third trimester: Secondary | ICD-10-CM | POA: Diagnosis not present

## 2023-07-27 DIAGNOSIS — O4690 Antepartum hemorrhage, unspecified, unspecified trimester: Principal | ICD-10-CM

## 2023-07-27 DIAGNOSIS — O4693 Antepartum hemorrhage, unspecified, third trimester: Secondary | ICD-10-CM | POA: Diagnosis present

## 2023-07-27 DIAGNOSIS — Z3A30 30 weeks gestation of pregnancy: Secondary | ICD-10-CM | POA: Diagnosis not present

## 2023-07-27 DIAGNOSIS — Z87891 Personal history of nicotine dependence: Secondary | ICD-10-CM

## 2023-07-27 DIAGNOSIS — N898 Other specified noninflammatory disorders of vagina: Secondary | ICD-10-CM | POA: Diagnosis not present

## 2023-07-27 LAB — URINALYSIS, ROUTINE W REFLEX MICROSCOPIC
Bilirubin Urine: NEGATIVE
Glucose, UA: NEGATIVE mg/dL
Ketones, ur: NEGATIVE mg/dL
Leukocytes,Ua: NEGATIVE
Nitrite: NEGATIVE
Protein, ur: NEGATIVE mg/dL
Specific Gravity, Urine: 1.003 — ABNORMAL LOW (ref 1.005–1.030)
pH: 7 (ref 5.0–8.0)

## 2023-07-27 LAB — CBC
HCT: 33 % — ABNORMAL LOW (ref 36.0–46.0)
Hemoglobin: 10.6 g/dL — ABNORMAL LOW (ref 12.0–15.0)
MCH: 28 pg (ref 26.0–34.0)
MCHC: 32.1 g/dL (ref 30.0–36.0)
MCV: 87.1 fL (ref 80.0–100.0)
Platelets: 216 10*3/uL (ref 150–400)
RBC: 3.79 MIL/uL — ABNORMAL LOW (ref 3.87–5.11)
RDW: 14.7 % (ref 11.5–15.5)
WBC: 8.6 10*3/uL (ref 4.0–10.5)
nRBC: 0 % (ref 0.0–0.2)

## 2023-07-27 LAB — WET PREP, GENITAL
Clue Cells Wet Prep HPF POC: NONE SEEN
Sperm: NONE SEEN
Trich, Wet Prep: NONE SEEN
WBC, Wet Prep HPF POC: >=10 — AB (ref <10)
Yeast Wet Prep HPF POC: NONE SEEN

## 2023-07-27 LAB — PROTIME-INR
INR: 0.9 (ref 0.8–1.2)
Prothrombin Time: 12.3 s (ref 11.4–15.2)

## 2023-07-27 LAB — BASIC METABOLIC PANEL
Anion gap: 8 (ref 5–15)
BUN: 7 mg/dL (ref 6–20)
CO2: 22 mmol/L (ref 22–32)
Calcium: 8.7 mg/dL — ABNORMAL LOW (ref 8.9–10.3)
Chloride: 107 mmol/L (ref 98–111)
Creatinine, Ser: 0.49 mg/dL (ref 0.44–1.00)
GFR, Estimated: >60 mL/min (ref >60)
Glucose, Bld: 85 mg/dL (ref 70–99)
Potassium: 3.5 mmol/L (ref 3.5–5.1)
Sodium: 137 mmol/L (ref 135–145)

## 2023-07-27 LAB — TYPE AND SCREEN
ABO/RH(D): O POS
Antibody Screen: NEGATIVE

## 2023-07-27 MED ORDER — SODIUM CHLORIDE 0.9% FLUSH: 3.0000 mL | INTRAVENOUS | Status: DC | PRN

## 2023-07-27 MED ORDER — BETAMETHASONE SOD PHOS & ACET 6 (3-3) MG/ML IJ SUSP
12.0000 mg | INTRAMUSCULAR | Status: AC
  Administered 2023-07-27 – 2023-07-28 (×2): 12 mg via INTRAMUSCULAR
  Filled 2023-07-27: qty 5

## 2023-07-27 MED ORDER — DOCUSATE SODIUM 100 MG PO CAPS
100.0000 mg | ORAL_CAPSULE | Freq: Every day | ORAL | Status: DC
  Administered 2023-07-28 – 2023-07-30 (×3): 100 mg via ORAL
  Filled 2023-07-27 (×4): qty 1

## 2023-07-27 MED ORDER — ACETAMINOPHEN 325 MG PO TABS
650.0000 mg | ORAL_TABLET | ORAL | Status: DC | PRN
  Administered 2023-07-27 – 2023-07-29 (×4): 650 mg via ORAL
  Filled 2023-07-27 (×4): qty 2

## 2023-07-27 MED ORDER — SODIUM CHLORIDE 0.9% FLUSH
3.0000 mL | Freq: Two times a day (BID) | INTRAVENOUS | Status: DC
  Administered 2023-07-27: 3 mL via INTRAVENOUS
  Administered 2023-07-27 – 2023-07-28 (×2): 10 mL via INTRAVENOUS
  Administered 2023-07-28: 3 mL via INTRAVENOUS
  Administered 2023-07-29 – 2023-07-30 (×3): 10 mL via INTRAVENOUS

## 2023-07-27 MED ORDER — DIPHENHYDRAMINE HCL 25 MG PO CAPS
25.0000 mg | ORAL_CAPSULE | Freq: Every evening | ORAL | Status: DC | PRN
  Administered 2023-07-27 – 2023-07-29 (×3): 25 mg via ORAL
  Filled 2023-07-27 (×3): qty 1

## 2023-07-27 NOTE — MAU Provider Note (Addendum)
History     CSN: [id number]  Arrival date and time: 07/27/23 0757   None   Chief Complaint  Patient presents with   Vaginal Bleeding   Brandi Carter is a 33 y.o. female [phone] at [redacted]w[redacted]d who presents to MAU with vaginal bleeding and lower back pain. At around midnight, she noticed that she had mild dark red/brown vaginal spotting on her panty liner and had a few small clots in the toilet bowl after peeing. She woke up at around 6 am and after walking around, she noticed that she had increased vaginal bleeding that filled her panty liner without clots. Associated symptoms include mild persistent bilateral lower back pain that started a week ago. Patient took a Tylenol at midnight without much improvement to her back pain. On OB history, had resolved placenta previa with first child, 2 live births via vaginal delivery, and otherwise no complications with prenatal care, L&D, or postpartum care in prior live births. On sexual history, no recent intercourse and no history of STIs. On ROS, she denies fever, headaches, vision changes, chest pain, shortness of breath, nausea, vomiting, abnormal vaginal discharge, dysuria, abnormally increased urinary frequency.  OB History     Gravida  6   Para  2   Term  2   Preterm  0   AB  3   Living  2      SAB  3   IAB  0   Ectopic  0   Multiple  0   Live Births  2          Past Medical History:  Diagnosis Date   Alcohol abuse    As a teenager   Chicken pox    Frequent headaches    Infection    UTI   Migraines    Normal pregnancy in multigravida in third trimester 01/21/2016   SVD (spontaneous vaginal delivery) 01/23/2016   Past Surgical History:  Procedure Laterality Date   NO PAST SURGERIES     Family History  Problem Relation Age of Onset   Alcohol abuse Father    High blood pressure Father    Emotional abuse Father    Schizophrenia Father    Alcohol abuse Paternal Grandfather    Cancer Paternal Grandfather     Bipolar disorder Mother    Fibromyalgia Paternal Aunt    Social History   Tobacco Use   Smoking status: Former    Current packs/day: 0.00    Types: Cigarettes    Quit date: 2020    Years since quitting: 5.0   Smokeless tobacco: Never  Vaping Use   Vaping status: Never Used  Substance Use Topics   Alcohol use: No   Drug use: No  Started smoking around 33 yo and quit 33 yo, formerly smoked a little less than 1 ppd  Allergies:  Allergies  Allergen Reactions   Fish Oil    Prenatal Complete Hives   Medications Prior to Admission  Medication Sig Dispense Refill Last Dose/Taking   acetaminophen (TYLENOL) 500 MG tablet Take 1,000 mg by mouth every 6 (six) hours as needed for moderate pain (pain score 4-6).   07/26/2023 Bedtime   ferrous sulfate 325 (65 FE) MG EC tablet Take 325 mg by mouth 3 (three) times daily with meals.   07/26/2023   amoxicillin-clavulanate (AUGMENTIN) 875-125 MG tablet Take 1 tablet by mouth 2 (two) times daily.  0    NUVARING 0.12-0.015 MG/24HR vaginal ring Place 1 each vaginally every 28 (  twenty-eight) days.   3    ROS: see HPI above for relevant ROS  Physical Exam   Blood pressure 105/66, pulse 94, temperature 98.8 F (37.1 C), temperature source Oral, resp. rate 16, height 5\' 2"  (1.575 m), weight 72.6 kg, SpO2 97%, unknown if currently breastfeeding.  Physical Exam Exam conducted with a chaperone present.  Constitutional:      Appearance: Normal appearance.  Cardiovascular:     Rate and Rhythm: Normal rate and regular rhythm.     Heart sounds: No murmur heard. Pulmonary:     Effort: Pulmonary effort is normal.     Breath sounds: Normal breath sounds.  Abdominal:     Palpations: Abdomen is soft.     Tenderness: There is abdominal tenderness in the right lower quadrant and left lower quadrant.  Genitourinary:    General: Normal vulva.     Exam position: Lithotomy position.     Cervix: Dilated. Cervical bleeding present.     Comments: Cervix thick  and dilated to 1.5 cm, large clot visualized on speculum exam Musculoskeletal:     Lumbar back: Tenderness present.       Back:  Skin:    General: Skin is warm and dry.  Neurological:     General: No focal deficit present.     Mental Status: She is alert and oriented to person, place, and time.  Psychiatric:        Mood and Affect: Mood normal.        Behavior: Behavior normal.    MAU Course  MDM: high  This patient presents to the ED for concern of   Chief Complaint  Patient presents with   Vaginal Bleeding    These complaints involves an extensive number of treatment options, and is a complaint that carries with it a high risk of complications and morbidity.  The differential diagnosis for vaginal bleeding in pregnancy, third trimester includes placental abruption vs placenta previa vs cervical insufficiency. Less likely uterine rupture given lack of surgical OB history. Less likely preterm labor given lack of contractions on NST. Less likely infection given lack of vaginal discharge, cervical or vaginal lesions on physical exam. Less likely trauma given lack of recent intercourse.  Lab Tests: Other Wet prep The pertinent results include:  negative for BV, Trich, Candida infection  Imaging Studies ordered: [place] > 14 weeks Showed no evidence of placental abruption   Dispostion: transfer to primary hospital for admission   Assessment and Plan  [redacted] weeks gestation Vaginal bleeding in pregnancy, third trimester -although [place] does not show evidence for placental abruption, placental abruption is still of concern given vaginal bleeding with focal back pain in the third trimester with dilated cervix and blood clots visualized on speculum examination.  Roughly 30 cc of clot. -plan to transfer patient to [facility] for admission  [facility] 07/27/2023, 10:26 AM

## 2023-07-27 NOTE — ED Triage Notes (Signed)
Pt presents with lower back pain and vaginal bleeding since midnight. Normally a pt of [facility]. Now a pt of [facility]. Noted to have brown blood in pad. EDP at bedside for pelvic. OB rapid response nurse in med center

## 2023-07-27 NOTE — Progress Notes (Signed)
G6P2 at 30 3/7 weeks reports to [facility] with c/o of bleeding since midnight.  Brownish blood noted on pads and during speculum exam per Dr Rhae Hammock.  Benign pregnancy course.  No issues with previous 2 pregnancies.  Was seeing Dr Britt Bottom for her prenatal course until her insurance changed.  Has an appointment with [facility] on Feb 3 to establish care with them.  Talked with Dr Ralene Bathe.  POC to transfer patient to MAU for further evaluation. Dr Mitzi Hansen accepting patient to [facility].  Marvell Fuller RNC-OB RROB [id number]

## 2023-07-27 NOTE — H&P (Signed)
"Brandi" WARDA Carter is a 33 y.o. [id number] at [redacted]w[redacted]d by LMP c/w 10w [place]  presenting for vaginal bleeding. Brandi reports that she began to have severe back pain yesterday evening. It resolved enough for her to go to church, but she tossed and turned overnight until she got up to void around midnight and noticed bright red vaginal bleeding with small clots. She presented to an outside hospital and was transferred to the MAU. She denies decreased fetal movement, loss of fluid, or contractions. Her back pain has mostly resolved, but she does report hip pain that is unchanged from previous. She denies any recent intercourse. She denies any other symptoms.  Her pregnancy is otherwise uncomplicated. She is medically uncomplicated. She has a history of two prior uncomplicated vaginal deliveries. She has received her Tdap in this pregnancy. OB History     Gravida  6   Para  2   Term  2   Preterm  0   AB  3   Living  2      SAB  3   IAB  0   Ectopic  0   Multiple  0   Live Births  2          Past Medical History:  Diagnosis Date   Alcohol abuse    As a teenager   Chicken pox    Frequent headaches    Infection    UTI   Migraines    Normal pregnancy in multigravida in third trimester 01/21/2016   SVD (spontaneous vaginal delivery) 01/23/2016   Past Surgical History:  Procedure Laterality Date   NO PAST SURGERIES     Family History: family history includes Alcohol abuse in her father and paternal grandfather; Bipolar disorder in her mother; Cancer in her paternal grandfather; Emotional abuse in her father; Fibromyalgia in her paternal aunt; High blood pressure in her father; Schizophrenia in her father. Social History:  reports that she quit smoking about 5 years ago. Her smoking use included cigarettes. She has never used smokeless tobacco. She reports that she does not drink alcohol and does not use drugs.     Maternal Diabetes: No Genetic Screening: Declined Maternal  Ultrasounds/Referrals: Normal Fetal Ultrasounds or other Referrals:  None Maternal Substance Abuse:  No Significant Maternal Medications:  None Significant Maternal Lab Results:  None Number of Prenatal Visits:greater than 3 verified prenatal visits Maternal Vaccinations:TDap Other Comments:  None  Review of Systems  All other systems reviewed and are negative.  Maternal Medical History:  Reason for admission: Vaginal bleeding.   Fetal activity: Perceived fetal activity is normal.   Prenatal complications: Bleeding.   No cholelithiasis, HIV, PIH, infection, IUGR, nephrolithiasis, oligohydramnios, placental abnormality, polyhydramnios, pre-eclampsia, preterm labor, substance abuse, thrombocytopenia or thrombophilia.   Prenatal Complications - Diabetes: none.   Dilation: 1.5 Effacement (%): Thick Exam by:: Dr Nobie Putnam Blood pressure 105/66, pulse 88, temperature 98.8 F (37.1 C), temperature source Oral, resp. rate 16, height 5\' 2"  (1.575 m), weight 72.6 kg, SpO2 97%, unknown if currently breastfeeding. Maternal Exam:  Abdomen: Patient reports no abdominal tenderness. Fetal presentation: vertex Introitus: not evaluated.   Multiple blood clots in vagina and coming from cervix per MAU physician, equaling approximately 30ccs Pelvis: adequate for delivery.   Cervix: not evaluated. Exam performed by MAU physician, not repeated  Fetal Exam Fetal Monitor Review: Mode: ultrasound.   Baseline rate: 135.  Variability: moderate (6-25 bpm).   Pattern: accelerations present and no decelerations.   Fetal State  Assessment: Category I - tracings are normal.   Physical Exam Vitals reviewed.  Constitutional:      Appearance: Normal appearance. She is normal weight.  HENT:     Head: Normocephalic.  Eyes:     Extraocular Movements: Extraocular movements intact.  Cardiovascular:     Rate and Rhythm: Normal rate.     Comments: Well perfused Pulmonary:     Effort: Pulmonary effort is  normal.  Abdominal:     Comments: Gravid, non-tender  Musculoskeletal:        General: Normal range of motion.     Cervical back: Normal range of motion.  Skin:    General: Skin is warm and dry.  Neurological:     General: No focal deficit present.     Mental Status: She is alert.  Psychiatric:        Mood and Affect: Mood normal.        Behavior: Behavior normal.        Thought Content: Thought content normal.        Judgment: Judgment normal.     Prenatal labs: ABO, Rh: O positive 03/07/23 Antibody: negative 03/07/23 Rubella: Immune 03/07/23 RPR: non-reactive 03/07/23 HBsAg: negative 03/07/23 HIV: non-reactive 03/07/23 GBS:  ordered, pending  Assessment/Plan: "Brandi" KATNISS Carter is a 33 y.o. [id number] at [redacted]w[redacted]d by LMP c/w 10w [place]  presenting for vaginal bleeding.   -VB in pregnancy: clinical presentation concerning for placental abruption. Hgb 10.6, normal coags. Normal wet prep, no recent intercourse. T&S and GBS ordered and pending. Will admit for monitoring and steroids. No concern for preterm labor currently, but will give magnesium infusion if pt develops s/sx PTL. -FWB: Reactive, reassuring tracing on admission. Plan TID and PRN NSTs. Patient counseled on reasons to call out to her RN. Limited admission US shows cephalic presentation, posterior placenta, normal AFI, and no evidence of abruption. Plan growth [place] tomorrow.   Dispo: Admit to maternal specialty unit for management of above. Anticipate inpatient status for at least two midnights.  Samba Cumba A Arthea Nobel 07/27/2023, 1:56 PM

## 2023-07-27 NOTE — ED Notes (Signed)
Report given to [facility] 

## 2023-07-27 NOTE — ED Notes (Addendum)
Called [facility] for Transport to [facility].  Spoke with Maneshia @8 :41am.  Dr. Julien Girt Accepting. Going to Rm 122

## 2023-07-27 NOTE — ED Notes (Signed)
Cervix closed per exam

## 2023-07-27 NOTE — MAU Note (Signed)
.  Brandi Carter is a 33 y.o. at [redacted]w[redacted]d here in MAU reporting: bleeding since around MN "small amount with mini clots" - "some with wiping this morning" No c/o srom, decreased FM, contractions, HA, chest pain or visual disturbances. EFM explained and applied to soft non tender abd.    Onset of complaint: [place] Pain score: 4- hip in to back Vitals:   07/27/23 0957 07/27/23 1016  BP: 105/66   Pulse: 94   Resp:    Temp: 98.8 F (37.1 C)   SpO2: 100% 97%

## 2023-07-27 NOTE — Progress Notes (Signed)
Waiting on [facility] for transport.

## 2023-07-27 NOTE — ED Provider Notes (Signed)
[place] [facility] Provider Note   CSN: [id number] Arrival date & time: 07/27/23  0757     History  Chief Complaint  Patient presents with   Vaginal Bleeding    Brandi Carter is a 33 y.o. female.  33 year old G6 P2-0-3-2 at 30 weeks by first trimester ultrasound presenting to the emergency department today with vaginal bleeding and low back discomfort.  The patient states that this started overnight last night after midnight.  The patient states that she is having bleeding consistent with her periods before she was pregnant.  The patient states that she has been following with [facility] with Dr. Cena Benton but is currently transitioning to the women Center here and has not had an appointment here yet.  Patient reports she is having normal fetal movements.  She denies any contractions.   Vaginal Bleeding      Home Medications Prior to Admission medications   Medication Sig Start Date End Date Taking? Authorizing Provider  acetaminophen (TYLENOL) 500 MG tablet Take 1,000 mg by mouth every 6 (six) hours as needed for moderate pain (pain score 4-6).   Yes [provider]  ferrous sulfate 325 (65 FE) MG EC tablet Take 325 mg by mouth 3 (three) times daily with meals.   Yes [provider]  amoxicillin-clavulanate (AUGMENTIN) 875-125 MG tablet Take 1 tablet by mouth 2 (two) times daily. 12/10/17   [provider]  NUVARING 0.12-0.015 MG/24HR vaginal ring Place 1 each vaginally every 28 (twenty-eight) days.  11/13/17   [provider]      Allergies    Fish oil and Prenatal complete    Review of Systems   Review of Systems  Genitourinary:  Positive for vaginal bleeding.    Physical Exam Updated Vital Signs BP 104/64 (BP Location: Right Arm)   Pulse 87   Temp 98.1 F (36.7 C) (Oral)   Resp 16   Ht 5\' 2"  (1.575 m)   Wt 72.6 kg   SpO2 97%   BMI 29.26 kg/m  Physical Exam Vitals and nursing note  reviewed.   Gen: NAD Eyes: PERRL, EOMI HEENT: no oropharyngeal swelling Neck: trachea midline Resp: clear to auscultation bilaterally Card: RRR, no murmurs, rubs, or gallops Abd: nontender, nondistended Extremities: no calf tenderness, no edema Vascular: 2+ radial pulses bilaterally, 2+ DP pulses bilaterally Skin: no rashes Psyc: acting appropriately   ED Results / Procedures / Treatments   Labs (all labs ordered are listed, but only abnormal results are displayed) Labs Reviewed  WET PREP, GENITAL - Abnormal; Notable for the following components:      Result Value   WBC, Wet Prep HPF POC >=10 (*)    All other components within normal limits  CBC - Abnormal; Notable for the following components:   RBC 3.79 (*)    Hemoglobin 10.6 (*)    HCT 33.0 (*)    All other components within normal limits  BASIC METABOLIC PANEL - Abnormal; Notable for the following components:   Calcium 8.7 (*)    All other components within normal limits  CULTURE, BETA STREP (GROUP B ONLY)  PROTIME-INR  URINALYSIS, ROUTINE W REFLEX MICROSCOPIC  TYPE AND SCREEN    EKG None  Radiology [place] MFM OB LIMITED Result Date: 07/27/2023 ----------------------------------------------------------------------  OBSTETRICS REPORT                        (Signed Final 07/27/2023 02:09 pm) ---------------------------------------------------------------------- Patient Info  ID #:       [id number]                          D.O.B.:  [birth year] (32 yrs)(F)  Name:       Brandi Carter [facility]               Visit Date: 07/27/2023 11:16 am ---------------------------------------------------------------------- Performed By  Attending:        Lin Landsman      Ref. Address:      [facility]                    MD  Performed By:     Marcellina Millin          Secondary Phy.:    [facility] MAU/Triage                    RDMS  Referred By:      Cyndi Lennert                 [facility]                    [facility] MD                                [facility] ---------------------------------------------------------------------- Orders  #  Description                           Code        Ordered By  1  [place] MFM OB LIMITED                     76815.01    Marcheta Grammes ----------------------------------------------------------------------  #  Order #                     Accession #                Episode #  1  [id number]                   [id number]                 [id number] ---------------------------------------------------------------------- Indications  Vaginal bleeding in pregnancy, third trimester  O46.93  [redacted] weeks gestation of pregnancy                 Z3A.30 ---------------------------------------------------------------------- Fetal Evaluation  Num Of Fetuses:          1  Fetal Heart Rate(bpm):   141  Cardiac Activity:        Observed  Presentation:            Cephalic  Placenta:                Posterior  P. Cord Insertion:       Visualized  Amniotic Fluid  AFI FV:      Within normal limits  AFI Sum(cm)     %Tile       Largest Pocket(cm)  16.1            58          5.7  RUQ(cm)       RLQ(cm)       LUQ(cm)  LLQ(cm)  5.7           3.3           4.2            2.9  Comment:    No placental abruption or previa identified. ---------------------------------------------------------------------- OB History  Gravidity:    6         Term:   2        Prem:   0        SAB:   3  TOP:          0       Ectopic:  0        Living: 2 ---------------------------------------------------------------------- Gestational Age  Best:          30w 3d     Det. By:  Marcella Dubs         EDD:   10/02/23 ---------------------------------------------------------------------- Anatomy  Cranium:               Appears normal         Stomach:                Appears normal, left                                                                        sided  Cavum:                 Appears normal         Kidneys:                Appear normal  Ventricles:            Appears normal          Bladder:                Appears normal  Diaphragm:             Appears normal ---------------------------------------------------------------------- Cervix Uterus Adnexa  Cervix  Not visualized (advanced GA >24wks)  Uterus  No abnormality visualized.  Right Ovary  Within normal limits.  Left Ovary  Not visualized.  Adnexa  No abnormality visualized ---------------------------------------------------------------------- Impression  Limited exam due to vaginal bleeding in pregnancy  Good fetal movement and amniotic fluid volume  No evidence of placenta previa or abruption. ---------------------------------------------------------------------- Recommendations  Clinical correlation recommended. ----------------------------------------------------------------------              Lin Landsman, MD Electronically Signed Final Report   07/27/2023 02:09 pm ----------------------------------------------------------------------    Procedures Procedures    Medications Ordered in ED Medications  acetaminophen (TYLENOL) tablet 650 mg (has no administration in time range)  docusate sodium (COLACE) capsule 100 mg (100 mg Oral Patient Refused/Not Given 07/27/23 1406)  sodium chloride flush (NS) 0.9 % injection 3-10 mL (10 mLs Intravenous Given 07/27/23 1405)  sodium chloride flush (NS) 0.9 % injection 3-10 mL (has no administration in time range)  betamethasone acetate-betamethasone sodium phosphate (CELESTONE) injection 12 mg (12 mg Intramuscular Given 07/27/23 1406)    ED Course/ Medical Decision Making/ A&P  Medical Decision Making 32 year old female G6 P2-0-3-2 who is 30 weeks by first trimester ultrasound presenting to the emergency department today with vaginal bleeding and low back pain.  Will place patient on the toco monitor here.  Will perform a speculum exam here and there is no imminent delivery will transfer to [facility] for further evaluation for ruling out  labor.  The patient's speculum exam shows some dark blood in the posterior cul-de-sac with no presenting fetal parts.  I did call and discussed patient's case with the [facility] attending on-call who does recommend that she get over to the MAU at colon to rule out labor.  We are setting up [facility] to transport the patient.  Amount and/or Complexity of Data Reviewed Labs: ordered.  Risk Decision regarding hospitalization.           Final Clinical Impression(s) / ED Diagnoses Final diagnoses:  Vaginal bleeding during pregnancy  [redacted] weeks gestation of pregnancy    Rx / DC Orders ED Discharge Orders     None         Durwin Glaze, MD 07/27/23 [phone]

## 2023-07-28 ENCOUNTER — Inpatient Hospital Stay (HOSPITAL_COMMUNITY): Payer: Medicaid Other

## 2023-07-28 DIAGNOSIS — Z3A30 30 weeks gestation of pregnancy: Secondary | ICD-10-CM

## 2023-07-28 DIAGNOSIS — O26893 Other specified pregnancy related conditions, third trimester: Secondary | ICD-10-CM

## 2023-07-28 DIAGNOSIS — N898 Other specified noninflammatory disorders of vagina: Secondary | ICD-10-CM

## 2023-07-28 MED ORDER — CALCIUM CARBONATE ANTACID 500 MG PO CHEW
1.0000 | CHEWABLE_TABLET | Freq: Two times a day (BID) | ORAL | Status: DC | PRN
  Administered 2023-07-28 – 2023-07-29 (×2): 200 mg via ORAL
  Filled 2023-07-28 (×2): qty 1

## 2023-07-28 NOTE — Progress Notes (Signed)
Patient ID: Brandi Carter, female   DOB: [birth year], 33 y.o.   MRN: [id number]  Subjective: Has been mostly lying in bed, but when she got up this morning to use the restroom she noticed continued bleeding of dark red blood with small clots. +Fms. Denies LOF or CTX.   Objective:  Vitals:   07/28/23 0759 07/28/23 1132  BP: (!) 94/46 103/62  Pulse: 97 89  Resp: 18 16  Temp: 98.1 F (36.7 C) 98.3 F (36.8 C)  SpO2: 98% 99%    Results for orders placed or performed during the hospital encounter of 07/27/23 (from the past 24 hours)  Type and screen [facility]     Status: None   Collection Time: 07/27/23  2:28 PM  Result Value Ref Range   ABO/RH(D) O POS    Antibody Screen NEG    Sample Expiration      07/30/2023,2359 Performed at [facility], [address], [place], [place] [address]   Urinalysis, Routine w reflex microscopic -Urine, Clean Catch     Status: Abnormal   Collection Time: 07/27/23  3:51 PM  Result Value Ref Range   Color, Urine STRAW (A) YELLOW   APPearance CLEAR CLEAR   Specific Gravity, Urine 1.003 (L) 1.005 - 1.030   pH 7.0 5.0 - 8.0   Glucose, UA NEGATIVE NEGATIVE mg/dL   Hgb urine dipstick MODERATE (A) NEGATIVE   Bilirubin Urine NEGATIVE NEGATIVE   Ketones, ur NEGATIVE NEGATIVE mg/dL   Protein, ur NEGATIVE NEGATIVE mg/dL   Nitrite NEGATIVE NEGATIVE   Leukocytes,Ua NEGATIVE NEGATIVE   RBC / HPF 0-5 0 - 5 RBC/hpf   WBC, UA 0-5 0 - 5 WBC/hpf   Bacteria, UA RARE (A) NONE SEEN   Squamous Epithelial / HPF 0-5 0 - 5 /HPF  Culture, beta strep (group b only)     Status: None (Preliminary result)   Collection Time: 07/27/23  3:51 PM   Specimen: Vaginal/Rectal; Genital  Result Value Ref Range   Specimen Description VAGINAL/RECTAL    Special Requests NONE    Culture      CULTURE REINCUBATED FOR BETTER GROWTH Performed at [facility], [address] [place], [place] [address]    Report Status PENDING    Gen: alert,  NAD Abd: gravid, soft, no tenderness  Resp: aerating well, no distress LE: no significant edema, no calf tenderness  A/P:  33 yo [id number] @ 30.4 admitted for third trimester bleeding -Pad counts -S/p BMZ x 1, second dose tonight -[place] today for growth -NST TID or more frequently as clinically indicated -FWB: category I, TOCO quiet

## 2023-07-29 NOTE — Plan of Care (Signed)

## 2023-07-29 NOTE — Progress Notes (Signed)
Patient ID: Brandi Carter, female   DOB: Oct 27, 1990, 33 y.o.   MRN: [id number]  Subjective: Has been up to bathroom with minimal brown discharge, no clots or frank red VB so far today. +Fms. Denies LOF or CTX.   Objective:  Vitals:   07/29/23 0542 07/29/23 0835  BP: (!) 104/57 (!) 105/57  Pulse: 82 95  Resp: 18 14  Temp: 98.4 F (36.9 C) 98.2 F (36.8 C)  SpO2: 100% 97%    No results found for this or any previous visit (from the past 24 hours).  Gen: alert, NAD Abd: gravid, soft, no tenderness  Resp: aerating well, no distress LE: no significant edema, no calf tenderness  [place] 1/17: Est. FW: 1640 gm 3 lb 10 oz 45 %   A/P:  33 yo [id number] @ 30.5 admitted for third trimester bleeding -Pad counts -S/p BMZ x 2, completed 1/17 -NST TID or more frequently as clinically indicated -FWB: category I, TOCO quiet' DISPO: Dc home tomorrow if 24hrs without new bleeding episode, pt amenable all questions answered, has appt in-office 1/21 already

## 2023-07-30 NOTE — Plan of Care (Signed)
Problem: Education: Goal: Knowledge of General Education information will improve Description: Including pain rating scale, medication(s)/side effects and non-pharmacologic comfort measures 07/30/2023 1311 by Evans Lance, RN Outcome: Not Progressing 07/30/2023 1157 by Evans Lance, RN Outcome: Adequate for Discharge   Problem: Health Behavior/Discharge Planning: Goal: Ability to manage health-related needs will improve 07/30/2023 1311 by Evans Lance, RN Outcome: Not Progressing 07/30/2023 1157 by Evans Lance, RN Outcome: Adequate for Discharge   Problem: Clinical Measurements: Goal: Ability to maintain clinical measurements within normal limits will improve 07/30/2023 1311 by Evans Lance, RN Outcome: Not Progressing 07/30/2023 1157 by Evans Lance, RN Outcome: Adequate for Discharge Goal: Will remain free from infection 07/30/2023 1311 by Evans Lance, RN Outcome: Not Progressing 07/30/2023 1157 by Evans Lance, RN Outcome: Adequate for Discharge Goal: Diagnostic test results will improve 07/30/2023 1311 by Evans Lance, RN Outcome: Not Progressing 07/30/2023 1157 by Evans Lance, RN Outcome: Adequate for Discharge Goal: Respiratory complications will improve 07/30/2023 1311 by Evans Lance, RN Outcome: Not Progressing 07/30/2023 1157 by Evans Lance, RN Outcome: Adequate for Discharge Goal: Cardiovascular complication will be avoided 07/30/2023 1311 by Evans Lance, RN Outcome: Not Progressing 07/30/2023 1157 by Evans Lance, RN Outcome: Adequate for Discharge   Problem: Activity: Goal: Risk for activity intolerance will decrease 07/30/2023 1311 by Evans Lance, RN Outcome: Not Progressing 07/30/2023 1157 by Evans Lance, RN Outcome: Adequate for Discharge   Problem: Nutrition: Goal: Adequate nutrition will be maintained 07/30/2023 1311 by Evans Lance, RN Outcome: Not Progressing 07/30/2023 1157 by Evans Lance, RN Outcome: Adequate for Discharge   Problem: Coping: Goal: Level of anxiety will decrease 07/30/2023 1311 by Evans Lance, RN Outcome: Not Progressing 07/30/2023 1157 by Evans Lance, RN Outcome: Adequate for Discharge   Problem: Elimination: Goal: Will not experience complications related to bowel motility 07/30/2023 1311 by Evans Lance, RN Outcome: Not Progressing 07/30/2023 1157 by Evans Lance, RN Outcome: Adequate for Discharge Goal: Will not experience complications related to urinary retention 07/30/2023 1311 by Evans Lance, RN Outcome: Not Progressing 07/30/2023 1157 by Evans Lance, RN Outcome: Adequate for Discharge   Problem: Pain Managment: Goal: General experience of comfort will improve and/or be controlled 07/30/2023 1311 by Evans Lance, RN Outcome: Not Progressing 07/30/2023 1157 by Evans Lance, RN Outcome: Adequate for Discharge   Problem: Safety: Goal: Ability to remain free from injury will improve 07/30/2023 1311 by Evans Lance, RN Outcome: Not Progressing 07/30/2023 1157 by Evans Lance, RN Outcome: Adequate for Discharge   Problem: Skin Integrity: Goal: Risk for impaired skin integrity will decrease 07/30/2023 1311 by Evans Lance, RN Outcome: Not Progressing 07/30/2023 1157 by Evans Lance, RN Outcome: Adequate for Discharge   Problem: Education: Goal: Knowledge of disease or condition will improve 07/30/2023 1311 by Evans Lance, RN Outcome: Not Progressing 07/30/2023 1157 by Evans Lance, RN Outcome: Adequate for Discharge Goal: Knowledge of the prescribed therapeutic regimen will improve 07/30/2023 1311 by Evans Lance, RN Outcome: Not Progressing 07/30/2023 1157 by Evans Lance, RN Outcome: Adequate for Discharge Goal: Individualized Educational Video(s) 07/30/2023 1311 by Evans Lance, RN Outcome: Not Progressing 07/30/2023 1157 by Evans Lance, RN Outcome:  Adequate for Discharge   Problem: Clinical Measurements: Goal: Complications related to the disease process, condition or treatment will be avoided or minimized 07/30/2023 1311 by Evans Lance, RN  Outcome: Not Progressing 07/30/2023 1157 by Evans Lance, RN Outcome: Adequate for Discharge

## 2023-07-30 NOTE — Plan of Care (Signed)
  Problem: Education: Goal: Knowledge of General Education information will improve Description: Including pain rating scale, medication(s)/side effects and non-pharmacologic comfort measures Outcome: Adequate for Discharge   Problem: Health Behavior/Discharge Planning: Goal: Ability to manage health-related needs will improve Outcome: Adequate for Discharge   Problem: Clinical Measurements: Goal: Ability to maintain clinical measurements within normal limits will improve Outcome: Adequate for Discharge Goal: Will remain free from infection Outcome: Adequate for Discharge Goal: Diagnostic test results will improve Outcome: Adequate for Discharge Goal: Respiratory complications will improve Outcome: Adequate for Discharge Goal: Cardiovascular complication will be avoided Outcome: Adequate for Discharge   Problem: Activity: Goal: Risk for activity intolerance will decrease Outcome: Adequate for Discharge   Problem: Nutrition: Goal: Adequate nutrition will be maintained Outcome: Adequate for Discharge   Problem: Coping: Goal: Level of anxiety will decrease Outcome: Adequate for Discharge   Problem: Elimination: Goal: Will not experience complications related to bowel motility Outcome: Adequate for Discharge Goal: Will not experience complications related to urinary retention Outcome: Adequate for Discharge   Problem: Pain Managment: Goal: General experience of comfort will improve and/or be controlled Outcome: Adequate for Discharge   Problem: Safety: Goal: Ability to remain free from injury will improve Outcome: Adequate for Discharge   Problem: Skin Integrity: Goal: Risk for impaired skin integrity will decrease Outcome: Adequate for Discharge   Problem: Education: Goal: Knowledge of disease or condition will improve Outcome: Adequate for Discharge Goal: Knowledge of the prescribed therapeutic regimen will improve Outcome: Adequate for Discharge Goal:  Individualized Educational Video(s) Outcome: Adequate for Discharge   Problem: Clinical Measurements: Goal: Complications related to the disease process, condition or treatment will be avoided or minimized Outcome: Adequate for Discharge

## 2023-07-30 NOTE — Progress Notes (Signed)
Patient ID: Brandi Carter, female   DOB: Nov 12, 1990, 33 y.o.   MRN: [id number]  Subjective: Has been up to bathroom with no further brown discharge, no clots or frank red VB so far today. +Fms. Denies LOF or CTX. Just completed NST  Objective:  Vitals:   07/30/23 0811 07/30/23 1137  BP: 107/61 114/70  Pulse: 79 88  Resp: 16 18  Temp: 98.2 F (36.8 C) 98.3 F (36.8 C)  SpO2: 99% 100%    No results found for this or any previous visit (from the past 24 hours).  Gen: alert, NAD Abd: gravid, soft, no tenderness  Resp: aerating well, no distress LE: no significant edema, no calf tenderness  Nst with 10x10 accels, mod var, no decels, TOCO quiet  [place] 1/17: Est. FW: 1640 gm 3 lb 10 oz 45 %   A/P:  33 yo [id number] @ 30.6 admitted for third trimester bleeding -Pad counts -S/p BMZ x 2, completed 1/17 -NST TID or more frequently as clinically indicated -FWB: category I, TOCO quiet' DISPO: Dc home today given 24hrs without new bleeding episode. F/u on scheduled 1/21 appt. Recommended pelvic rest at this time given recent VB episode, bedrest not indicated

## 2023-07-30 NOTE — Discharge Summary (Signed)
Physician Discharge Summary  Patient ID: Brandi Carter MRN: [id number] DOB/AGE: 35-Sep-1992 35 y.o.  Admit date: 07/27/2023 Discharge date: 07/30/2023  Admission Diagnoses:  Discharge Diagnoses:  Principal Problem:   Vaginal bleeding in pregnancy, third trimester   Discharged Condition: good  Hospital Course: Patient admitted at 35 3/7 for antepartum admission for vaginal bleeding in third trimester. Hgb 10.6 with normal coags on admit. BMTZ course completed during inpatient stay. NST TID were reactive. MFM scan ordered while in-house was negative for obvious previa or abruption, vertex IUP noted. By HD#4, had been >35hrs without VB. Has outpatient f/u on 1/21 with [facility].   Narrative & Impression    ----------------------------------------------------------------------  OBSTETRICS REPORT                       (Signed Final 07/28/2023 01:06 pm) ---------------------------------------------------------------------- Patient Info    ID #:       [id number]                          D.O.B.:  [birth year] (33 yrs)(F)  Name:       Brandi Carter [facility]               Visit Date: 07/28/2023 10:05 am ---------------------------------------------------------------------- Performed By    Attending:        Lin Landsman      Ref. Address:     Dewitt Hoes-                    MD                                                             GYN                                                             [address]                                                             [address] [place]                                                             [address]  Performed By:     Isac Sarna        Secondary Phy.:  [facility] MAU/Triage                    BS RDMS  Referred By:      Carlisle Cater       [facility] ---------------------------------------------------------------------- Orders    #  Description                           Code        Ordered By  1  [place] MFM OB DETAIL +14 WK               76811.01    Ambrose Mantle ----------------------------------------------------------------------    #  Order #                     Accession #                Episode #  1  [id number]                   [id number]                 [id number] ---------------------------------------------------------------------- Indications    Vaginal discharge during pregnancy in third    O26.893  trimester  [redacted] weeks gestation of pregnancy                Z3A.30  No genetic screening ---------------------------------------------------------------------- Fetal Evaluation    Num Of Fetuses:         1  Fetal Heart Rate(bpm):  145  Cardiac Activity:       Observed  Presentation:           Cephalic  Placenta:               Posterior Fundal  P. Cord Insertion:      Not well visualized    Amniotic Fluid  AFI FV:      Within normal limits    AFI Sum(cm)     %Tile       Largest Pocket(cm)  17.6            65          5.1    RUQ(cm)       RLQ(cm)       LUQ(cm)        LLQ(cm)  4.6           5.1           3.1            4.8 ---------------------------------------------------------------------- Biometry    BPD:      79.2  mm     G. Age:  31w 6d         76  %    CI:        79.07   %    70 - 86  FL/HC:      19.6   %    19.3 - 21.3  HC:      281.6  mm     G. Age:  30w 6d         22  %    HC/AC:      1.02        0.96 - 1.17  AC:      275.4  mm     G. Age:  31w 4d         76  %    FL/BPD:     69.8   %    71 - 87  FL:       55.3  mm     G. Age:  29w 1d          7  %    FL/AC:      20.1   %    20 - 24  HUM:        48  mm     G. Age:  28w 1d        < 5  %  CER:      38.2  mm     G. Age:  31w 2d         51  %    LV:        3.9  mm    Est. FW:    1640  gm    3 lb 10  oz      45  % ---------------------------------------------------------------------- OB History    Gravidity:    6         Term:   2        Prem:   0        SAB:   3  TOP:          0       Ectopic:  0        Living: 2 ---------------------------------------------------------------------- Gestational Age    U/S Today:     30w 6d                                        EDD:   09/30/23  Best:          30w 4d     Det. By:  Marcella Dubs         EDD:   10/02/23 ---------------------------------------------------------------------- Targeted Anatomy    Central Nervous System  Calvarium/Cranial V.:  Appears normal         Cereb./Vermis:          Appears normal  Cavum:                 Appears normal         Cisterna Magna:         Appears normal  Lateral Ventricles:    Appears normal         Midline Falx:           Appears normal  Choroid Plexus:        Appears normal    Spine  Cervical:              Limited                Sacral:  Limited  Thoracic:              Limited                Shape/Curvature:        Appears normal  Lumbar:                Limited    Head/Neck  Lips:                  Appears normal         Profile:                Not well visualized  Neck:                  Appears normal         Orbits/Eyes:            Appears normal  Nuchal Fold:           Not applicable         Mandible:               Not well visualized  Nasal Bone:            Present                Maxilla:                Not well visualized    Thorax  4 Chamber View:        Appears normal         Interventr. Septum:     Appears normal  Cardiac Rhythm:        Normal                 Cardiac Axis:           Normal  Cardiac Situs:         Appears normal         Diaphragm:              Appears normal  Rt Outflow Tract:      Appears normal         3 Vessel View:          Not well visualized  Lt Outflow Tract:      Appears normal         3 V Trachea View:       Appears normal  Aortic Arch:            Appears normal         IVC:                    Appears normal  Ductal Arch:           Appears normal         Crossing:               Appears normal  SVC:                   Appears normal    Abdomen  Ventral Wall:          Appears normal         Lt Kidney:              Appears normal  Cord Insertion:        Appears normal         Rt Kidney:  Appears normal  Situs:                 Appears normal         Bladder:                Appears normal  Stomach:               Appears normal    Extremities  Lt Humerus:            Appears normal         Lt Femur:               Appears normal  Rt Humerus:            Appears normal         Rt Femur:               Appears normal  Lt Forearm:            Appears normal         Lt Lower Leg:           Appears normal  Rt Forearm:            Appears normal         Rt Lower Leg:           Appears normal  Lt Hand:               Visualized             Lt Foot:                Not well visualized  Rt Hand:               Visualized             Rt Foot:                Not well visualized  Other  Umbilical Cord:        Normal 3-vessel        Genitalia:              Female-nml    Comment:     Technically difficult due to gestational age. ---------------------------------------------------------------------- Cervix Uterus Adnexa    Cervix  Normal appearance by transabdominal scan    Uterus  No abnormality visualized.    Right Ovary  Size(cm)     2.02   x   1.22   x  1.68      Vol(ml): 2.17  Within normal limits.    Left Ovary  Not visualized.    Cul De Sac  No free fluid seen.    Adnexa  No abnormality visualized ---------------------------------------------------------------------- Impression    Single intrauterine pregnancy here for a detailed anatomy  vaginal bleeding in the third trimester.  Normal anatomy with measurements consistent with dates  There is good fetal movement and amniotic fluid volume  Suboptimal views of the fetal  anatomy were obtained  secondary to fetal position and advanced gestational age. ---------------------------------------------------------------------- Recommendations    Clinical correlation recommended.    Treatments: BMTZ course  Discharge Exam: Blood pressure 114/70, pulse 88, temperature 98.3 F (36.8 C), temperature source Oral, resp. rate 18, height 5\' 2"  (1.575 m), weight 72.6 kg, SpO2 100%, unknown if currently breastfeeding.  Gen: alert, NAD Abd: gravid, soft, no tenderness  Resp: aerating well, no distress LE: no significant edema, no calf tenderness  Disposition: Discharge disposition: 01-Home or Self Care  Discharge Instructions     Discharge diet:  No restrictions   Complete by: As directed    Fetal Kick Count:  Lie on our left side for one hour after a meal, and count the number of times your baby kicks.  If it is less than 5 times, get up, move around and drink some juice.  Repeat the test 30 minutes later.  If it is still less than 5 kicks in an hour, notify your doctor.   Complete by: As directed    Notify physician for a general feeling that "something is not right"   Complete by: As directed    Notify physician for increase or change in vaginal discharge   Complete by: As directed    Notify physician for intestinal cramps, with or without diarrhea, sometimes described as "gas pain"   Complete by: As directed    Notify physician for leaking of fluid   Complete by: As directed    Notify physician for low, dull backache, unrelieved by heat or Tylenol   Complete by: As directed    Notify physician for menstrual like cramps   Complete by: As directed    Notify physician for pelvic pressure   Complete by: As directed    Notify physician for uterine contractions.  These may be painless and feel like the uterus is tightening or the baby is  "balling up"   Complete by: As directed    Notify physician for vaginal bleeding   Complete by: As directed     PRETERM LABOR:  Includes any of the follwing symptoms that occur between 20 - [redacted] weeks gestation.  If these symptoms are not stopped, preterm labor can result in preterm delivery, placing your baby at risk   Complete by: As directed    Sexual Activity:     Complete by: As directed    Avoid penetrative intercourse given recent VB      Allergies as of 07/30/2023       Reactions   Fish Oil    Prenatal Complete Hives        Medication List     STOP taking these medications    acetaminophen 500 MG tablet Commonly known as: TYLENOL   amoxicillin-clavulanate 875-125 MG tablet Commonly known as: AUGMENTIN   ferrous sulfate 325 (65 FE) MG EC tablet   NuvaRing 0.12-0.015 MG/24HR vaginal ring Generic drug: etonogestrel-ethinyl estradiol         Signed: [name] Chloris Marcoux 07/30/2023, 11:44 AM

## 2023-07-30 NOTE — Progress Notes (Signed)
AVS discharge instructions given to patient, when to return to MAU, VB, contractions, LOF. Pt has appointment for follow-up on Tuesday. Patient verbalizes understanding.

## 2023-08-01 LAB — CULTURE, BETA STREP (GROUP B ONLY)

## 2023-08-14 ENCOUNTER — Encounter: Payer: Medicaid Other | Admitting: Family Medicine

## 2023-08-31 ENCOUNTER — Inpatient Hospital Stay (HOSPITAL_COMMUNITY)
Admission: AD | Admit: 2023-08-31 | Discharge: 2023-08-31 | Disposition: A | Discharge status: Home / Self Care | Payer: Medicaid Other | Attending: Obstetrics & Gynecology | Admitting: Obstetrics & Gynecology

## 2023-08-31 ENCOUNTER — Encounter (HOSPITAL_COMMUNITY): Payer: Self-pay | Admitting: Obstetrics and Gynecology

## 2023-08-31 DIAGNOSIS — R112 Nausea with vomiting, unspecified: Secondary | ICD-10-CM

## 2023-08-31 DIAGNOSIS — Z20828 Contact with and (suspected) exposure to other viral communicable diseases: Secondary | ICD-10-CM | POA: Diagnosis present

## 2023-08-31 DIAGNOSIS — Z3A35 35 weeks gestation of pregnancy: Secondary | ICD-10-CM | POA: Insufficient documentation

## 2023-08-31 DIAGNOSIS — B349 Viral infection, unspecified: Secondary | ICD-10-CM | POA: Insufficient documentation

## 2023-08-31 DIAGNOSIS — R519 Headache, unspecified: Secondary | ICD-10-CM | POA: Diagnosis present

## 2023-08-31 DIAGNOSIS — O98513 Other viral diseases complicating pregnancy, third trimester: Secondary | ICD-10-CM | POA: Diagnosis not present

## 2023-08-31 LAB — URINALYSIS, ROUTINE W REFLEX MICROSCOPIC
Bilirubin Urine: NEGATIVE
Glucose, UA: NEGATIVE mg/dL
Hgb urine dipstick: NEGATIVE
Ketones, ur: 80 mg/dL — AB
Leukocytes,Ua: NEGATIVE
Nitrite: NEGATIVE
Protein, ur: NEGATIVE mg/dL
Specific Gravity, Urine: 1.024 (ref 1.005–1.030)
pH: 5 (ref 5.0–8.0)

## 2023-08-31 LAB — RESP PANEL BY RT-PCR (RSV, FLU A&B, COVID)  RVPGX2
Influenza A by PCR: NEGATIVE
Influenza B by PCR: NEGATIVE
Resp Syncytial Virus by PCR: NEGATIVE
SARS Coronavirus 2 by RT PCR: NEGATIVE

## 2023-08-31 MED ORDER — SODIUM CHLORIDE 0.9 % IV BOLUS
1000.0000 mL | Freq: Once | INTRAVENOUS | Status: AC
  Administered 2023-08-31: 1000 mL via INTRAVENOUS

## 2023-08-31 MED ORDER — SODIUM CHLORIDE 0.9 % IV SOLN
25.0000 mg | Freq: Once | INTRAVENOUS | Status: AC
  Administered 2023-08-31: 25 mg via INTRAVENOUS
  Filled 2023-08-31: qty 1

## 2023-08-31 MED ORDER — FAMOTIDINE IN NACL 20-0.9 MG/50ML-% IV SOLN
20.0000 mg | Freq: Once | INTRAVENOUS | Status: AC
  Administered 2023-08-31: 20 mg via INTRAVENOUS
  Filled 2023-08-31: qty 50

## 2023-08-31 MED ORDER — PROMETHAZINE HCL 12.5 MG PO TABS: 12.5000 mg | ORAL_TABLET | Freq: Four times a day (QID) | ORAL | 0 refills | Status: DC | PRN

## 2023-08-31 MED ORDER — GLYCOPYRROLATE 0.2 MG/ML IJ SOLN
0.2000 mg | Freq: Once | INTRAMUSCULAR | Status: AC
  Administered 2023-08-31: 0.2 mg via INTRAVENOUS
  Filled 2023-08-31: qty 1

## 2023-08-31 MED ORDER — ONDANSETRON 8 MG PO TBDP: 8.0000 mg | ORAL_TABLET | Freq: Three times a day (TID) | ORAL | 0 refills | Status: DC | PRN

## 2023-08-31 MED ORDER — ONDANSETRON 4 MG PO TBDP: 8.0000 mg | ORAL_TABLET | Freq: Once | ORAL | Status: DC

## 2023-08-31 MED ORDER — ONDANSETRON HCL 4 MG/2ML IJ SOLN
4.0000 mg | Freq: Once | INTRAMUSCULAR | Status: AC
  Administered 2023-08-31: 4 mg via INTRAVENOUS
  Filled 2023-08-31: qty 2

## 2023-08-31 NOTE — MAU Provider Note (Signed)
N/V Third Trimester    S [name] is a 33 y.o. [phone] pregnant female at [redacted]w[redacted]d who presents to MAU today with complaint of N/V starting 2/19 evening.  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  She started vomiting last night and has been unable to keep anything down since.  She reports irregular contractions. Denies VB, LOF.  Endorses +FM.   Receives care at [facility]. Prenatal records reviewed.  Pertinent items noted in HPI and remainder of comprehensive ROS otherwise negative.   O BP (!) 98/57   Pulse (!) 127   Temp 99 F (37.2 C) (Oral)   Resp 14   SpO2 97%  Physical Exam Vitals and nursing note reviewed.  Constitutional:      General: She is not in acute distress.    Appearance: She is not ill-appearing.     Comments: Appears to feel unwell   HENT:     Head: Normocephalic and atraumatic.     Right Ear: External ear normal.     Left Ear: External ear normal.     Nose: Nose normal.     Mouth/Throat:     Mouth: Mucous membranes are dry.     Pharynx: Oropharynx is clear.  Eyes:     Extraocular Movements: Extraocular movements intact.     Conjunctiva/sclera: Conjunctivae normal.  Cardiovascular:     Rate and Rhythm: Tachycardia present.  Pulmonary:     Effort: Pulmonary effort is normal. No respiratory distress.  Abdominal:     General: There is no distension.     Palpations: Abdomen is soft.     Comments: gravid  Musculoskeletal:        General: No swelling. Normal range of motion.     Cervical back: Normal range of motion.  Skin:    General: Skin is warm and dry.  Neurological:     Mental Status: She is alert and oriented to person, place, and time. Mental status is at baseline.     Motor: No weakness.     Gait: Gait normal.  Psychiatric:        Mood and Affect: Mood normal.        Behavior: Behavior normal.    NST: 150bpm, moderate variability, +accels, no decels, no ctx yet uterine irritability   MDM:  Low  MAU Course: UA = noninfectious, ketones 80, no protein or glucose  Quad screen = negative  Giving 1L NS given tachycardia and dry mucous membranes Gave 4mg  IV Zofran   Pt was able to only tolerate a few ice chips prior to her feeling like it was coming back up.  Did not vomit but states she's afraid to eat any thing else.  Will give IV phenergan 25mg  and another fluid bolus.    Pt feeling better and tolerating PO prior to d/c.  AP #[redacted] weeks gestation #N/V likely to unknown viral illness, no COVID / Influenza A or B / RSV  Stable for D/C with supportive care  Discharge from MAU in stable condition with strict/usual precautions [facility] up at [facility] as scheduled for ongoing prenatal care  Allergies as of 08/31/2023       Reactions   Fish Oil    Prenatal Complete Hives        Medication List     TAKE these medications    ferrous sulfate 325 (65 FE) MG tablet Take 325 mg by mouth daily with breakfast.  ondansetron 8 MG disintegrating tablet Commonly known as: ZOFRAN-ODT Take 1 tablet (8 mg total) by mouth every 8 (eight) hours as needed for nausea or vomiting.   pantoprazole 40 MG tablet Commonly known as: PROTONIX Take 40 mg by mouth daily.   promethazine 12.5 MG tablet Commonly known as: PHENERGAN Take 1 tablet (12.5 mg total) by mouth every 6 (six) hours as needed for nausea or vomiting.        Hessie Dibble, MD 08/31/2023 4:40 PM

## 2023-08-31 NOTE — MAU Note (Signed)
..  Brandi Carter is a 33 y.o. at [redacted]w[redacted]d here in MAU reporting: nausea and vomiting that started last night, states she has been unable to keep anything down since then. Denies VB or LOF. +FM, but states its slightly decreased over the last hour. 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 Also reporting a headache.   Pain score: head-5     ctx- 6 Vitals:   08/31/23 1347  BP: (!) 109/57  Pulse: (!) 120  Resp: 14  Temp: 99 F (37.2 C)  SpO2: 97%      Lab orders placed from triage:   UA

## 2023-08-31 NOTE — Discharge Instructions (Signed)

## 2023-09-05 LAB — OB RESULTS CONSOLE GBS: GBS: NEGATIVE

## 2023-09-26 ENCOUNTER — Inpatient Hospital Stay (HOSPITAL_COMMUNITY)
Admission: AD | Admit: 2023-09-26 | Discharge: 2023-09-28 | DRG: 807 | Disposition: A | Discharge status: Home / Self Care | Attending: Obstetrics and Gynecology | Admitting: Obstetrics and Gynecology

## 2023-09-26 ENCOUNTER — Encounter (HOSPITAL_COMMUNITY): Payer: Self-pay | Admitting: Obstetrics and Gynecology

## 2023-09-26 ENCOUNTER — Other Ambulatory Visit: Payer: Self-pay | Admitting: Obstetrics and Gynecology

## 2023-09-26 ENCOUNTER — Inpatient Hospital Stay (HOSPITAL_COMMUNITY): Admitting: Anesthesiology

## 2023-09-26 ENCOUNTER — Other Ambulatory Visit: Payer: Self-pay

## 2023-09-26 DIAGNOSIS — Z3A39 39 weeks gestation of pregnancy: Secondary | ICD-10-CM

## 2023-09-26 DIAGNOSIS — O26893 Other specified pregnancy related conditions, third trimester: Secondary | ICD-10-CM | POA: Diagnosis present

## 2023-09-26 DIAGNOSIS — O9902 Anemia complicating childbirth: Principal | ICD-10-CM | POA: Diagnosis present

## 2023-09-26 DIAGNOSIS — Z87891 Personal history of nicotine dependence: Secondary | ICD-10-CM | POA: Diagnosis not present

## 2023-09-26 LAB — CBC
HCT: 37.6 % (ref 36.0–46.0)
Hemoglobin: 12.1 g/dL (ref 12.0–15.0)
MCH: 27.1 pg (ref 26.0–34.0)
MCHC: 32.2 g/dL (ref 30.0–36.0)
MCV: 84.3 fL (ref 80.0–100.0)
Platelets: 193 10*3/uL (ref 150–400)
RBC: 4.46 MIL/uL (ref 3.87–5.11)
RDW: 16.7 % — ABNORMAL HIGH (ref 11.5–15.5)
WBC: 12 10*3/uL — ABNORMAL HIGH (ref 4.0–10.5)
nRBC: 0 % (ref 0.0–0.2)

## 2023-09-26 LAB — TYPE AND SCREEN
ABO/RH(D): O POS
Antibody Screen: NEGATIVE

## 2023-09-26 MED ORDER — DIPHENHYDRAMINE HCL 50 MG/ML IJ SOLN: 12.5000 mg | INTRAMUSCULAR | Status: DC | PRN

## 2023-09-26 MED ORDER — LACTATED RINGERS IV SOLN: 500.0000 mL | INTRAVENOUS | Status: DC | PRN

## 2023-09-26 MED ORDER — LACTATED RINGERS IV SOLN
500.0000 mL | Freq: Once | INTRAVENOUS | Status: AC
  Administered 2023-09-26: 500 mL via INTRAVENOUS

## 2023-09-26 MED ORDER — LIDOCAINE HCL (PF) 1 % IJ SOLN
30.0000 mL | INTRAMUSCULAR | Status: DC | PRN
Start: 2023-09-26 — End: 2023-09-27

## 2023-09-26 MED ORDER — SOD CITRATE-CITRIC ACID 500-334 MG/5ML PO SOLN
30.0000 mL | ORAL | Status: DC | PRN
Start: 2023-09-26 — End: 2023-09-27

## 2023-09-26 MED ORDER — OXYCODONE-ACETAMINOPHEN 5-325 MG PO TABS: 1.0000 | ORAL_TABLET | ORAL | Status: DC | PRN

## 2023-09-26 MED ORDER — OXYTOCIN-SODIUM CHLORIDE 30-0.9 UT/500ML-% IV SOLN
2.5000 [IU]/h | INTRAVENOUS | Status: DC
  Administered 2023-09-27: 2.5 [IU]/h via INTRAVENOUS

## 2023-09-26 MED ORDER — ACETAMINOPHEN 325 MG PO TABS: 650.0000 mg | ORAL_TABLET | ORAL | Status: DC | PRN

## 2023-09-26 MED ORDER — TERBUTALINE SULFATE 1 MG/ML IJ SOLN
0.2500 mg | Freq: Once | INTRAMUSCULAR | Status: DC | PRN
Start: 2023-09-26 — End: 2023-09-27

## 2023-09-26 MED ORDER — ONDANSETRON HCL 4 MG/2ML IJ SOLN
4.0000 mg | Freq: Four times a day (QID) | INTRAMUSCULAR | Status: DC | PRN
  Administered 2023-09-26: 4 mg via INTRAVENOUS
  Filled 2023-09-26: qty 2

## 2023-09-26 MED ORDER — OXYCODONE-ACETAMINOPHEN 5-325 MG PO TABS: 2.0000 | ORAL_TABLET | ORAL | Status: DC | PRN

## 2023-09-26 MED ORDER — EPHEDRINE 5 MG/ML INJ: 10.0000 mg | INTRAVENOUS | Status: DC | PRN

## 2023-09-26 MED ORDER — FENTANYL CITRATE (PF) 100 MCG/2ML IJ SOLN
50.0000 ug | INTRAMUSCULAR | Status: DC | PRN
  Administered 2023-09-26: 100 ug via INTRAVENOUS
  Administered 2023-09-26: 50 ug via INTRAVENOUS
  Filled 2023-09-26 (×3): qty 2

## 2023-09-26 MED ORDER — PHENYLEPHRINE 80 MCG/ML (10ML) SYRINGE FOR IV PUSH (FOR BLOOD PRESSURE SUPPORT)
80.0000 ug | PREFILLED_SYRINGE | INTRAVENOUS | Status: DC | PRN
  Filled 2023-09-26: qty 10

## 2023-09-26 MED ORDER — OXYTOCIN BOLUS FROM INFUSION
333.0000 mL | Freq: Once | INTRAVENOUS | Status: AC
  Administered 2023-09-27: 333 mL via INTRAVENOUS

## 2023-09-26 MED ORDER — FENTANYL-BUPIVACAINE-NACL 0.5-0.125-0.9 MG/250ML-% EP SOLN
12.0000 mL/h | EPIDURAL | Status: DC | PRN
  Administered 2023-09-26: 12 mL/h via EPIDURAL
  Filled 2023-09-26: qty 250

## 2023-09-26 MED ORDER — OXYTOCIN-SODIUM CHLORIDE 30-0.9 UT/500ML-% IV SOLN
1.0000 m[IU]/min | INTRAVENOUS | Status: DC
Start: 2023-09-26 — End: 2023-09-27
  Administered 2023-09-26: 2 m[IU]/min via INTRAVENOUS
  Filled 2023-09-26: qty 500

## 2023-09-26 MED ORDER — PHENYLEPHRINE 80 MCG/ML (10ML) SYRINGE FOR IV PUSH (FOR BLOOD PRESSURE SUPPORT): 80.0000 ug | PREFILLED_SYRINGE | INTRAVENOUS | Status: DC | PRN

## 2023-09-26 MED ORDER — LACTATED RINGERS IV SOLN: INTRAVENOUS | Status: DC

## 2023-09-26 NOTE — H&P (Signed)
 Brandi Carter is a 33 y.o. female presenting for elective induction at term. Painful but irregular contractions for past 3d, hard to time. +FM, denies VB, LOF  PNC c/b 1) Prior antepartum admission for VB in pregnancy - Admitted 1/16-19, completed BMTZ course, MFM scan neg for previa/abruption, no further VB throughout pregnancy; coags WNL 2)Anemia of pregnancy - PO iron  GBS neg OB History     Gravida  6   Para  2   Term  2   Preterm  0   AB  3   Living  2      SAB  3   IAB  0   Ectopic  0   Multiple  0   Live Births  2          Past Medical History:  Diagnosis Date   Alcohol abuse    As a teenager   Chicken pox    Frequent headaches    Infection    UTI   Migraines    Normal pregnancy in multigravida in third trimester 01/21/2016   SVD (spontaneous vaginal delivery) 01/23/2016   Past Surgical History:  Procedure Laterality Date   NO PAST SURGERIES     Family History: family history includes Alcohol abuse in her father and paternal grandfather; Bipolar disorder in her mother; Cancer in her paternal grandfather; Emotional abuse in her father; Fibromyalgia in her paternal aunt; High blood pressure in her father; Schizophrenia in her father. Social History:  reports that she quit smoking about 5 years ago. Her smoking use included cigarettes. She has never used smokeless tobacco. She reports that she does not drink alcohol and does not use drugs.     Maternal Diabetes: No1hr 115 Genetic Screening: Declined Maternal Ultrasounds/Referrals: Normal Fetal Ultrasounds or other Referrals:  None Maternal Substance Abuse:  No Significant Maternal Medications:  None Significant Maternal Lab Results:  Group B Strep negative Number of Prenatal Visits:greater than 3 verified prenatal visits Maternal Vaccinations:TDap Other Comments:  None  Review of Systems  Constitutional:  Negative for chills and fever.  Respiratory:  Negative for shortness of breath.    Cardiovascular:  Negative for chest pain, palpitations and leg swelling.  Gastrointestinal:  Negative for abdominal pain, nausea and vomiting.  Neurological:  Negative for dizziness, weakness and headaches.  Psychiatric/Behavioral:  Negative for suicidal ideas.    Maternal Medical History:  Reason for admission: Nausea.    Dilation: 5.5 Effacement (%): 70 Station: -2 Exam by:: Dr. Reina Carter Blood pressure 128/81, pulse 69, temperature 98 F (36.7 C), temperature source Oral, resp. rate 16, height 5\' 2"  (1.575 m), weight 79.5 kg, unknown if currently breastfeeding. Exam Physical Exam Constitutional:      General: She is not in acute distress.    Appearance: She is well-developed.  HENT:     Head: Normocephalic and atraumatic.  Eyes:     Pupils: Pupils are equal, round, and reactive to light.  Cardiovascular:     Rate and Rhythm: Normal rate and regular rhythm.     Heart sounds: No murmur heard.    No gallop.  Abdominal:     Tenderness: There is no abdominal tenderness. There is no guarding or rebound.  Genitourinary:    Vagina: Normal.  Musculoskeletal:        General: Normal range of motion.     Cervical back: Normal range of motion and neck supple.  Skin:    General: Skin is warm and dry.  Neurological:  Mental Status: She is alert and oriented to person, place, and time.     Prenatal labs: ABO, Rh: --/--/PENDING (03/18 1533) Antibody: PENDING (03/18 1533) Rubella: Immune (01/09 0000) RPR: Nonreactive (01/09 0000)  HBsAg: Negative (01/09 0000)  HIV: Non-reactive (01/09 0000)  GBS: Negative/-- (02/25 0000)   Cat 1 tracing TOCO q4-43m Clear AROM at 1610  Assessment/Plan: This is a 33yo [id number] by 10wk TVUS presenting for elective IOL at term. Pelvis proven to 8lb5oz. GBS neg. S/p AROM, declining epidural for now. Anticipate SVD  Brandi Carter Brandi Carter 09/26/2023, 4:10 PM

## 2023-09-26 NOTE — Anesthesia Preprocedure Evaluation (Signed)
 Anesthesia Evaluation  Patient identified by MRN, date of birth, ID band Patient awake    Reviewed: Allergy & Precautions, Patient's Chart, lab work & pertinent test results  History of Anesthesia Complications Negative for: history of anesthetic complications  Airway Mallampati: II  TM Distance: >3 FB Neck ROM: Full    Dental no notable dental hx.    Pulmonary former smoker   Pulmonary exam normal        Cardiovascular negative cardio ROS Normal cardiovascular exam     Neuro/Psych  Headaches    GI/Hepatic negative GI ROS, Neg liver ROS,,,  Endo/Other  negative endocrine ROS    Renal/GU negative Renal ROS  negative genitourinary   Musculoskeletal negative musculoskeletal ROS (+)    Abdominal   Peds  Hematology negative hematology ROS (+)   Anesthesia Other Findings Day of surgery medications reviewed with patient.  Reproductive/Obstetrics (+) Pregnancy                             Anesthesia Physical Anesthesia Plan  ASA: 2  Anesthesia Plan: Epidural   Post-op Pain Management:    Induction:   PONV Risk Score and Plan: Treatment may vary due to age or medical condition  Airway Management Planned: Natural Airway  Additional Equipment: Fetal Monitoring  Intra-op Plan:   Post-operative Plan:   Informed Consent: I have reviewed the patients History and Physical, chart, labs and discussed the procedure including the risks, benefits and alternatives for the proposed anesthesia with the patient or authorized representative who has indicated his/her understanding and acceptance.       Plan Discussed with:   Anesthesia Plan Comments:        Anesthesia Quick Evaluation

## 2023-09-26 NOTE — Progress Notes (Signed)
 Occ urge to push, now 8-9/90/-1, IV pain meds requested. Pitocinw as at 8mU/min, will stop for now as I am about to being c-section BP 101/73   Pulse (!) 107   Temp 97.9 F (36.6 C) (Oral)   Resp 16   Ht 5\' 2"  (1.575 m)   Wt 79.5 kg   BMI 32.06 kg/m  Anticipate SVD

## 2023-09-26 NOTE — Progress Notes (Signed)
 Labor Note  S: More painful contractions  O: BP 103/73   Pulse 86   Temp 97.9 F (36.6 C) (Oral)   Resp 16   Ht 5\' 2"  (1.575 m)   Wt 79.5 kg   BMI 32.06 kg/m  CE: 7-8/70/-2 FHR: Baseline 135, +accels, -decels, modvariability TOCO q5-47min  A/P: This is a 33 y.o. [id number] at [redacted]w[redacted]d  admitted for elective IOL FWB: cat 1 MWB: declining epidural for now, otherwise well Labor course: Active labor but still at minus two, amenable to 2x2 pitocin augmentation at this time  Anticipate SVD

## 2023-09-26 NOTE — Progress Notes (Signed)
 Labor Note  S: More painful contractions  O: BP 107/71   Pulse 97   Temp 98 F (36.7 C) (Oral)   Resp 16   Ht 5\' 2"  (1.575 m)   Wt 79.5 kg   BMI 32.06 kg/m  CE: 6-7/70/-2 FHR: Baseline 135, +accels, -decels, modvariability TOCO q5-12min  A/P: This is a 33 y.o. [id number] at [redacted]w[redacted]d  admitted for elective IOL FWB: cat 1 MWB: declining epidural for now, otherwise well Labor course: Not currently on pitocin as making cervical change s/p AROM. However, given station, reviewed starting pitocin. Patient declines at this time and will decide at next check in 2hrs - if no change, will start at that time  Anticipate SVD

## 2023-09-27 ENCOUNTER — Encounter (HOSPITAL_COMMUNITY): Payer: Self-pay | Admitting: Obstetrics and Gynecology

## 2023-09-27 LAB — RPR: RPR Ser Ql: NONREACTIVE

## 2023-09-27 LAB — CBC
HCT: 38.2 % (ref 36.0–46.0)
Hemoglobin: 12.3 g/dL (ref 12.0–15.0)
MCH: 26.9 pg (ref 26.0–34.0)
MCHC: 32.2 g/dL (ref 30.0–36.0)
MCV: 83.6 fL (ref 80.0–100.0)
Platelets: 186 10*3/uL (ref 150–400)
RBC: 4.57 MIL/uL (ref 3.87–5.11)
RDW: 16.5 % — ABNORMAL HIGH (ref 11.5–15.5)
WBC: 18.8 10*3/uL — ABNORMAL HIGH (ref 4.0–10.5)
nRBC: 0 % (ref 0.0–0.2)

## 2023-09-27 MED ORDER — ZOLPIDEM TARTRATE 5 MG PO TABS: 5.0000 mg | ORAL_TABLET | Freq: Every evening | ORAL | Status: DC | PRN

## 2023-09-27 MED ORDER — BENZOCAINE-MENTHOL 20-0.5 % EX AERO
1.0000 | INHALATION_SPRAY | CUTANEOUS | Status: DC | PRN
  Administered 2023-09-27: 1 via TOPICAL
  Filled 2023-09-27: qty 56

## 2023-09-27 MED ORDER — IBUPROFEN 600 MG PO TABS
600.0000 mg | ORAL_TABLET | Freq: Four times a day (QID) | ORAL | Status: DC
  Administered 2023-09-27 – 2023-09-28 (×6): 600 mg via ORAL
  Filled 2023-09-27 (×6): qty 1

## 2023-09-27 MED ORDER — LIDOCAINE HCL (PF) 1 % IJ SOLN
INTRAMUSCULAR | Status: DC | PRN
  Administered 2023-09-26: 4 mL via EPIDURAL

## 2023-09-27 MED ORDER — WITCH HAZEL-GLYCERIN EX PADS: 1.0000 | MEDICATED_PAD | CUTANEOUS | Status: DC | PRN

## 2023-09-27 MED ORDER — COCONUT OIL OIL
1.0000 | TOPICAL_OIL | Status: DC | PRN
  Administered 2023-09-27: 1 via TOPICAL

## 2023-09-27 MED ORDER — DIBUCAINE (PERIANAL) 1 % EX OINT: 1.0000 | TOPICAL_OINTMENT | CUTANEOUS | Status: DC | PRN

## 2023-09-27 MED ORDER — DIPHENHYDRAMINE HCL 25 MG PO CAPS: 25.0000 mg | ORAL_CAPSULE | Freq: Four times a day (QID) | ORAL | Status: DC | PRN

## 2023-09-27 MED ORDER — SIMETHICONE 80 MG PO CHEW: 80.0000 mg | CHEWABLE_TABLET | ORAL | Status: DC | PRN

## 2023-09-27 MED ORDER — SENNOSIDES-DOCUSATE SODIUM 8.6-50 MG PO TABS
2.0000 | ORAL_TABLET | Freq: Every day | ORAL | Status: DC
  Administered 2023-09-27 – 2023-09-28 (×2): 2 via ORAL
  Filled 2023-09-27 (×2): qty 2

## 2023-09-27 MED ORDER — ACETAMINOPHEN 325 MG PO TABS: 650.0000 mg | ORAL_TABLET | ORAL | Status: DC | PRN

## 2023-09-27 MED ORDER — TETANUS-DIPHTH-ACELL PERTUSSIS 5-2.5-18.5 LF-MCG/0.5 IM SUSY: 0.5000 mL | PREFILLED_SYRINGE | Freq: Once | INTRAMUSCULAR | Status: DC

## 2023-09-27 MED ORDER — ONDANSETRON HCL 4 MG PO TABS: 4.0000 mg | ORAL_TABLET | ORAL | Status: DC | PRN

## 2023-09-27 MED ORDER — BUPIVACAINE HCL (PF) 0.25 % IJ SOLN
INTRAMUSCULAR | Status: DC | PRN
  Administered 2023-09-26: 1 mL via EPIDURAL

## 2023-09-27 MED ORDER — ONDANSETRON HCL 4 MG/2ML IJ SOLN: 4.0000 mg | INTRAMUSCULAR | Status: DC | PRN

## 2023-09-27 MED ORDER — SENNOSIDES-DOCUSATE SODIUM 8.6-50 MG PO TABS: 2.0000 | ORAL_TABLET | Freq: Every day | ORAL | Status: DC

## 2023-09-27 NOTE — Lactation Note (Signed)
 This note was copied from a baby's chart. Lactation Consultation Note  Patient Name: Brandi Carter IONGE'X Date: 09/27/2023 Age:35 hours Reason for consult: Mother's request;Term Mom asked to see Lactation. Mom also wanted comfort gels for sore nipples. LC gave the gels. Mom asked about supplements to help w/her milk backing up under her arm. Mom stated both times she BF her milk backs up under arm and wanted to know what to take. 1st shift LC gave recommendations but they were out of the product LC recommended. LC recommended [employer] if the other company was out of that product. [employer] had what mom was looking for.   Maternal Data    Feeding    LATCH Score          Comfort (Breast/Nipple): Filling, red/small blisters or bruises, mild/mod discomfort         Lactation Tools Discussed/Used Tools: Comfort gels  Interventions Interventions: Comfort gels  Discharge    Consult Status Consult Status: Follow-up Date: 09/28/23 Follow-up type: In-patient    Charyl Dancer 09/27/2023, 9:08 PM

## 2023-09-27 NOTE — Lactation Note (Addendum)
 This note was copied from a baby's chart. Lactation Consultation Note  Patient Name: Brandi Carter GLOVF'I Date: 09/27/2023 Age:35 hours Reason for consult: Initial assessment;Term;Engorgement  P3 mom of 10 hour old infant consulted for initial assessment. Performed initial lactation consultation. Gathered history and discussed breastfeeding basics including: infant I&O, breast care, frequency of feedings in first 24 hours, infant sleepiness during first 24 hours, milk production, use of hand expression; how to reach lactation, pump access for home and support from lactation during hospital stay.  Subjective: reports breastfeeding with other two children was challenging due to tongue ties and consistent engorgement of the right breast and axillary area (golf ball size clogs/swelling). Mom reports noticing minimal output or differences in output from left breast compared to right. Expression was 3-4 ounces from the right breast at 1 month postpartum with previous children; less output on the left. Suppressed lactation after 1 month due to unresolved axillary engorgement.  Objective: noted right side axillary engorgement, palpable breast tissue  Infant feeding not observed. Infant STS with mom during visit.  Placed ice packs under mom's right arm pit for preventive care. Hand outs provided (tongue tie and LC services). Encouraged to call out with any questions or concerns.   Maternal Data Has patient been taught Hand Expression?: Yes Does the patient have breastfeeding experience prior to this delivery?: Yes How long did the patient breastfeed?: breastfed for 1 month due to challenges; tongue tie for both children  Feeding Mother's Current Feeding Choice: Breast Milk   Lactation Tools Discussed/Used Tools: Other (comment) (ice packs)  Interventions Interventions: Breast feeding basics reviewed;Reverse pressure;Skin to skin;Ice;Education;LC Services brochure (Tongue Tie  Resources)  Discharge Discharge Education: Engorgement and breast care Pump: Personal (Spectra)  Consult Status Consult Status: Follow-up Date: 09/28/23 Follow-up type: In-patient    [name] 09/27/2023, 1:40 PM

## 2023-09-27 NOTE — Progress Notes (Signed)
 Post Partum Day 0 Subjective: Patient is doing well. Pain is controlled. Ambulating, voiding, tolerating PO. Minimal lochia. Breastfeeding.   Objective: Patient Vitals for the past 24 hrs:  BP Temp Temp src Pulse Resp SpO2  09/27/23 1500 92/61 97.8 F (36.6 C) Oral 88 18 98 %  09/27/23 1051 108/73 (!) 97.5 F (36.4 C) Oral 81 17 97 %  09/27/23 0630 (!) 91/54 -- -- 83 16 97 %  09/27/23 0520 95/62 97.9 F (36.6 C) Oral 84 18 97 %  09/27/23 0450 101/61 -- -- 87 -- --  09/27/23 0400 117/81 -- -- 94 -- --  09/27/23 0345 106/84 -- -- 95 -- --  09/27/23 0332 107/64 -- -- 92 -- --  09/27/23 0200 112/81 -- -- 82 -- --  09/27/23 0130 (!) 98/57 -- -- 89 -- --  09/27/23 0100 108/73 -- -- 85 -- 97 %  09/27/23 0030 119/72 -- -- 89 -- 97 %  09/27/23 0025 116/81 -- -- 78 -- 96 %  09/27/23 0020 110/73 -- -- 82 -- 98 %  09/27/23 0015 114/77 -- -- 83 -- 95 %  09/27/23 0010 111/75 -- -- 88 -- 99 %  09/27/23 0005 123/77 -- -- 85 -- 98 %  09/27/23 0000 118/69 -- -- 89 -- 97 %  09/26/23 2355 122/72 -- -- 88 -- 98 %  09/26/23 2322 126/74 97.9 F (36.6 C) Oral 77 -- --  09/26/23 2102 101/73 -- -- (!) 107 -- --  09/26/23 1914 103/73 97.9 F (36.6 C) Oral 86 16 --  09/26/23 1730 107/71 -- -- 97 -- --    Physical Exam:  General: alert, cooperative, and no distress Lochia: appropriate Uterine Fundus: firm DVT Evaluation: No evidence of DVT seen on physical exam.  Recent Labs    09/26/23 1534 09/27/23 0611  WBC 12.0* 18.8*  HGB 12.1 12.3  HCT 37.6 38.2  PLT 193 186    No results for input(s): "NA", "K", "CL", "CO2CT", "BUN", "CREATININE", "GLUCOSE", "BILITOT", "ALT", "AST", "ALKPHOS", "PROT", "ALBUMIN" in the last 72 hours.  No results for input(s): "CALCIUM", "MG", "PHOS" in the last 72 hours.  No results for input(s): "PROTIME", "APTT", "INR" in the last 72 hours.  No results for input(s): "PROTIME", "APTT", "INR", "FIBRINOGEN" in the last 72 hours. Assessment/Plan: Brandi Carter  33 y.o. [id number] PPD#1 sp SVD 1. PPC: routine PP care 2. Rh pos 3. Dispo: interested in d/c tomorrow if baby is cleared   LOS: 1 day   [name] 09/27/2023, 4:36 PM

## 2023-09-27 NOTE — Anesthesia Postprocedure Evaluation (Signed)
 Anesthesia Post Note  Patient: JONQUIL STUBBE  Procedure(s) Performed: AN AD HOC LABOR EPIDURAL     Patient location during evaluation: Mother Baby Anesthesia Type: Epidural Level of consciousness: awake and alert and oriented Pain management: satisfactory to patient Vital Signs Assessment: post-procedure vital signs reviewed and stable Respiratory status: respiratory function stable Cardiovascular status: stable Postop Assessment: no headache, no backache, epidural receding, patient able to bend at knees, no signs of nausea or vomiting, adequate PO intake and able to ambulate Anesthetic complications: no   No notable events documented.  Last Vitals:  Vitals:   09/27/23 1051 09/27/23 1500  BP: 108/73 92/61  Pulse: 81 88  Resp: 17 18  Temp: (!) 36.4 C 36.6 C  SpO2: 97% 98%    Last Pain:  Vitals:   09/27/23 1515  TempSrc:   PainSc: 0-No pain   Pain Goal:                   Aundria Rud, CRNA

## 2023-09-27 NOTE — Anesthesia Procedure Notes (Signed)
 Epidural Patient location during procedure: OB Start time: 09/26/2023 11:49 PM End time: 09/26/2023 11:52 PM  Staffing Anesthesiologist: Kaylyn Layer, MD Performed: anesthesiologist   Preanesthetic Checklist Completed: patient identified, IV checked, risks and benefits discussed, monitors and equipment checked, pre-op evaluation and timeout performed  Epidural Patient position: sitting Prep: DuraPrep and site prepped and draped Patient monitoring: heart rate, continuous pulse ox and blood pressure Approach: midline Location: L3-L4 Injection technique: LOR air  Needle:  Needle type: Tuohy  Needle gauge: 17 G Needle length: 9 cm Needle insertion depth: 5 cm Catheter type: closed end flexible Catheter size: 19 Gauge Catheter at skin depth: 10 cm  Assessment Events: blood not aspirated, injection not painful, no injection resistance, no paresthesia and negative IV test  Additional Notes Patient identified. Risks, benefits, and alternatives discussed with patient including but not limited to bleeding, infection, nerve damage, paralysis, failed block, incomplete pain control, headache, blood pressure changes, nausea, vomiting, reactions to medication, itching, and postpartum back pain. Confirmed with bedside nurse the patient's most recent platelet count. Confirmed with patient that they are not currently taking any anticoagulation, have any bleeding history, or any family history of bleeding disorders. Patient expressed understanding and wished to proceed. All questions were answered. Sterile technique was used throughout the entire procedure. Please see nursing notes for vital signs.   Crisp LOR with Tuohy needle on first pass. [place] 25g spinal needle introduced through Tuohy with clear CSF return prior to injection of intrathecal medication. Spinal needle withdrawn and epidural catheter threaded easily. Negative aspiration of catheter for heme or CSF prior to starting  epidural infusion. Warning signs of high block given to the patient including shortness of breath, tingling/numbness in hands, complete motor block, or any concerning symptoms with instructions to call for help. Patient was given instructions on fall risk and not to get out of bed. All questions and concerns addressed with instructions to call with any issues or inadequate analgesia.  Patient comfortable with contractions prior to my leaving room. Reason for block:procedure for pain

## 2023-09-28 MED ORDER — ACETAMINOPHEN 325 MG PO TABS: 650.0000 mg | ORAL_TABLET | ORAL | Status: AC | PRN

## 2023-09-28 MED ORDER — IBUPROFEN 600 MG PO TABS: 600.0000 mg | ORAL_TABLET | Freq: Four times a day (QID) | ORAL | Status: AC

## 2023-09-28 NOTE — Progress Notes (Signed)
 Post Partum Day 1 Subjective: no complaints, up ad lib, voiding, tolerating PO, + flatus, and breastfeeding. Lochia normal. Desires discharge today.  Objective: Vitals:   09/27/23 1500 09/27/23 1827 09/27/23 1954 09/28/23 0526  BP: 92/61 104/72 (!) 107/57 104/72  Pulse: 88 91 87 79  Resp: 18 16 17 16   Temp: 97.8 F (36.6 C) 98.3 F (36.8 C) 98.2 F (36.8 C)   TempSrc: Oral Oral Oral   SpO2: 98% 100% 98% 100%  Weight:      Height:         Physical Exam:  General: alert, cooperative, and no distress Lochia: appropriate Uterine Fundus: firm DVT Evaluation: No evidence of DVT seen on physical exam.  Recent Labs    09/26/23 1534 09/27/23 0611  HGB 12.1 12.3  HCT 37.6 38.2    Assessment/Plan: Brandi Carter is a 32yo G6 now P3033 PPD1 s/p SVD after elective IOL at term. 1. PPC: routine PP care 2. Rh pos  Dispo: Stable for discharge home with routine precautions and routine follow-up.  LOS: 2 days   Willa Frater, MD 09/28/2023, 9:05 AM

## 2023-09-28 NOTE — Discharge Summary (Signed)
 Postpartum Discharge Summary  Date of Service updated 3/18-3/20/25     Patient Name: Brandi Carter DOB: 05/31/1991 MRN: [id number]  Date of admission: 09/26/2023 Delivery date:09/27/2023 Delivering provider: Carlisle Cater Date of discharge: 09/28/2023  Admitting diagnosis: [redacted] weeks gestation of pregnancy [Z3A.39] Intrauterine pregnancy: [redacted]w[redacted]d     Secondary diagnosis:  Principal Problem:   [redacted] weeks gestation of pregnancy  Additional problems: none    Discharge diagnosis: Term Pregnancy Delivered                                              Post partum procedures: none Augmentation: AROM and Pitocin Complications: None  Hospital course: Induction of Labor With Vaginal Delivery   33 y.o. yo [phone] at [redacted]w[redacted]d was admitted to the hospital 09/26/2023 for induction of labor.  Indication for induction: Elective.  Patient had an labor course complicated by nothing. Membrane Rupture Time/Date: 4:04 PM,09/26/2023  Delivery Method:Vaginal, Spontaneous Episiotomy: None Lacerations:  None Details of delivery can be found in separate delivery note.  Patient had a postpartum course complicated by nothing. Patient is discharged home 09/28/23.  Newborn Data: Birth date:09/27/2023 Birth time:3:06 AM Gender:Female Living status:Living Apgars:9 ,9  Weight:3270 g  Magnesium Sulfate received: No BMZ received: Yes Rhophylac:No MMR:No T-DaP:Given prenatally Transfusion:No Immunizations administered: Immunization History  Administered Date(s) Administered   Tdap 10/28/2015    Physical exam  Vitals:   09/27/23 1500 09/27/23 1827 09/27/23 1954 09/28/23 0526  BP: 92/61 104/72 (!) 107/57 104/72  Pulse: 88 91 87 79  Resp: 18 16 17 16   Temp: 97.8 F (36.6 C) 98.3 F (36.8 C) 98.2 F (36.8 C)   TempSrc: Oral Oral Oral   SpO2: 98% 100% 98% 100%  Weight:      Height:       General: alert, cooperative, and no distress Lochia: appropriate Uterine Fundus: firm DVT Evaluation:  No evidence of DVT seen on physical exam. Labs: Lab Results  Component Value Date   WBC 18.8 (H) 09/27/2023   HGB 12.3 09/27/2023   HCT 38.2 09/27/2023   MCV 83.6 09/27/2023   PLT 186 09/27/2023      Latest Ref Rng & Units 07/27/2023    8:43 AM  CMP  Glucose 70 - 99 mg/dL 85   BUN 6 - 20 mg/dL 7   Creatinine 9.62 - 9.52 mg/dL 8.41   Sodium 324 - 401 mmol/L 137   Potassium 3.5 - 5.1 mmol/L 3.5   Chloride 98 - 111 mmol/L 107   CO2 22 - 32 mmol/L 22   Calcium 8.9 - 10.3 mg/dL 8.7    Edinburgh Score:    09/27/2023    9:04 PM  Edinburgh Postnatal Depression Scale Screening Tool  I have been able to laugh and see the funny side of things. 0  I have looked forward with enjoyment to things. 0  I have blamed myself unnecessarily when things went wrong. 0  I have been anxious or worried for no good reason. 0  I have felt scared or panicky for no good reason. 0  Things have been getting on top of me. 0  I have been so unhappy that I have had difficulty sleeping. 0  I have felt sad or miserable. 0  I have been so unhappy that I have been crying. 0  The thought of harming myself has  occurred to me. 0  Edinburgh Postnatal Depression Scale Total 0      After visit meds:  Allergies as of 09/28/2023       Reactions   Fish Oil    Prenatal Complete Hives        Medication List     STOP taking these medications    ferrous sulfate 325 (65 FE) MG tablet   ondansetron 8 MG disintegrating tablet Commonly known as: ZOFRAN-ODT   pantoprazole 40 MG tablet Commonly known as: PROTONIX   promethazine 12.5 MG tablet Commonly known as: PHENERGAN       TAKE these medications    acetaminophen 325 MG tablet Commonly known as: Tylenol Take 2 tablets (650 mg total) by mouth every 4 (four) hours as needed (for pain scale < 4).   ibuprofen 600 MG tablet Commonly known as: ADVIL Take 1 tablet (600 mg total) by mouth every 6 (six) hours.         Discharge home in stable  condition Infant Feeding: Breast Infant Disposition:home with mother Discharge instruction: per After Visit Summary and Postpartum booklet. Activity: Advance as tolerated. Pelvic rest for 6 weeks.  Diet: routine diet Anticipated Birth Control: Unsure Postpartum Appointment:6 weeks Additional Postpartum F/U:  routine Future Appointments:No future appointments. Follow up Visit:  Follow-up Information     [facility] Follow up in 6 week(s).   Contact information: [address]  [address] [place] [address] [phone]                     09/28/2023 Willa Frater, MD

## 2023-10-07 ENCOUNTER — Telehealth (HOSPITAL_COMMUNITY): Payer: Self-pay

## 2023-10-07 NOTE — Telephone Encounter (Signed)
 10/07/2023 1403  Name: Brandi Carter MRN: [id number] DOB: 1990-11-21  Reason for Call:  Transition of Care Hospital Discharge Call  Contact Status: Patient Contact Status: Message  Language assistant needed:          Follow-Up Questions:    Inocente Salles Postnatal Depression Scale:  In the Past 7 Days:    PHQ2-9 Depression Scale:     Discharge Follow-up:    Post-discharge interventions: NA  Signature  Signe Colt

## 2023-10-09 ENCOUNTER — Inpatient Hospital Stay (HOSPITAL_COMMUNITY)
